# Patient Record
Sex: Female | Born: 1970 | Race: Black or African American | Hispanic: No | Marital: Single | Smoking: Never smoker
Health system: Southern US, Community
[De-identification: ages and names within clinical notes are randomized; demographics above are authoritative.]

---

## 2020-02-12 ENCOUNTER — Other Ambulatory Visit: Payer: Self-pay | Admitting: Obstetrics and Gynecology

## 2020-02-12 DIAGNOSIS — D259 Leiomyoma of uterus, unspecified: Secondary | ICD-10-CM

## 2020-02-18 ENCOUNTER — Other Ambulatory Visit: Payer: Self-pay | Admitting: Interventional Radiology

## 2020-02-18 ENCOUNTER — Ambulatory Visit
Admission: RE | Admit: 2020-02-18 | Discharge: 2020-02-18 | Disposition: A | Discharge status: Home / Self Care | Payer: 59 | Source: Ambulatory Visit | Attending: Obstetrics and Gynecology | Admitting: Obstetrics and Gynecology

## 2020-02-18 ENCOUNTER — Encounter: Payer: Self-pay | Admitting: *Deleted

## 2020-02-18 ENCOUNTER — Other Ambulatory Visit: Payer: Self-pay

## 2020-02-18 DIAGNOSIS — D250 Submucous leiomyoma of uterus: Secondary | ICD-10-CM

## 2020-02-18 DIAGNOSIS — D259 Leiomyoma of uterus, unspecified: Secondary | ICD-10-CM

## 2020-02-18 HISTORY — PX: IR RADIOLOGIST EVAL & MGMT: IMG5224

## 2020-02-18 NOTE — Consult Note (Signed)
Chief Complaint:  Symptomatic uterine fibroids  Referring Physician(s): Cousins,Sheronette  History of Present Illness: Erin Bates is a 49 y.o. female G2, P1 A1.  No future pregnancy plans.  No menopausal symptoms.  Review of her menstrual cycle performed.  She has a regular cycle approximately every 28 days.  Cycle length is 6 days with 6 heavy days including passage of fresh blood clots.  Frequency a pad changes every 30 minutes for all 6 days.  No intra-.  Bleeding.  She also has bulk related symptoms including lower abdominal pain and cramping during the cycle.  She also experiences fatigue and low energy level related to iron deficiency anemia.  Symptoms have progressed over the last year.  No recent fiber therapies including hormone replacement therapies or surgeries.  She has had a previous tubal ligation.  No recent GYN infections or abnormal discharge.  Reported recent Pap smear was normal.  Office ultrasound 4-1 21 demonstrates at least 4 measurable fibroids measuring up to 8 cm.  Uterus measures up to 17 cm in length.  No past medical history on file.    Allergies: Patient has no allergy information on record.  Medications: Prior to Admission medications   Not on File     No family history on file.  Social History   Socioeconomic History  . Marital status: Not on file    Spouse name: Not on file  . Number of children: Not on file  . Years of education: Not on file  . Highest education level: Not on file  Occupational History  . Not on file  Tobacco Use  . Smoking status: Not on file  Substance and Sexual Activity  . Alcohol use: Not on file  . Drug use: Not on file  . Sexual activity: Not on file  Other Topics Concern  . Not on file  Social History Narrative  . Not on file   Social Determinants of Health   Financial Resource Strain:   . Difficulty of Paying Living Expenses:   Food Insecurity:   . Worried About Charity fundraiser in the Last  Year:   . Arboriculturist in the Last Year:   Transportation Needs:   . Film/video editor (Medical):   Marland Kitchen Lack of Transportation (Non-Medical):   Physical Activity:   . Days of Exercise per Week:   . Minutes of Exercise per Session:   Stress:   . Feeling of Stress :   Social Connections:   . Frequency of Communication with Friends and Family:   . Frequency of Social Gatherings with Friends and Family:   . Attends Religious Services:   . Active Member of Clubs or Organizations:   . Attends Archivist Meetings:   Marland Kitchen Marital Status:     Review of Systems  Review of Systems: A 12 point ROS discussed and pertinent positives are indicated in the HPI above.  All other systems are negative.  Physical Exam No direct physical exam was performed, telephone health visit only today because of Covid pandemic.   Vital Signs: There were no vitals taken for this visit.  Imaging: No results found.  Labs:  CBC: No results for input(s): WBC, HGB, HCT, PLT in the last 8760 hours.  COAGS: No results for input(s): INR, APTT in the last 8760 hours.  BMP: No results for input(s): NA, K, CL, CO2, GLUCOSE, BUN, CALCIUM, CREATININE, GFRNONAA, GFRAA in the last 8760 hours.  Invalid input(s): CMP  LIVER FUNCTION TESTS: No results for input(s): BILITOT, AST, ALT, ALKPHOS, PROT, ALBUMIN in the last 8760 hours.   Assessment and Plan:  Symptomatic uterine fibroids with abnormal menstrual bleeding and associated lower abdominal pain, fatigue, and low energy level related to iron deficiency anemia and chronic blood loss.  Treatment options reviewed including uterine fibroid embolization.  The embolization procedure was reviewed in detail including the procedure, overnight recovery, expected goals, outcomes, risk, and complications.  All questions were addressed.  She has a clear understanding of the procedure.  She would like to proceed with a work-up including pelvic MRI.  Plan:  Scheduled for outpatient pelvic MRI without and with contrast to assess fibroid anatomy for embolization.  Anticipate scheduling her procedure in the next few weeks at [facility].  Thank you for this interesting consult.  I greatly enjoyed meeting Emila Veith and look forward to participating in their care.  A copy of this report was sent to the requesting provider on this date.  Electronically Signed: Greggory Keen 02/18/2020, 1:38 PM   I spent a total of  40 Minutes   in remote  clinical consultation, greater than 50% of which was counseling/coordinating care for this patient with symptomatic uterine fibroids.    Visit type: Audio only (telephone). Audio (no video) only due to patient's lack of internet/smartphone capability. Alternative for in-person consultation at [facility], [address] [place], [place], [place]. This visit type was conducted due to national recommendations for restrictions regarding the COVID-19 Pandemic (e.g. social distancing).  This format is felt to be most appropriate for this patient at this time.  All issues noted in this document were discussed and addressed.

## 2020-03-10 ENCOUNTER — Ambulatory Visit (HOSPITAL_COMMUNITY)
Admission: RE | Admit: 2020-03-10 | Discharge: 2020-03-10 | Disposition: A | Discharge status: Home / Self Care | Payer: Managed Care, Other (non HMO) | Source: Ambulatory Visit | Attending: Interventional Radiology | Admitting: Interventional Radiology

## 2020-03-10 ENCOUNTER — Other Ambulatory Visit: Payer: Self-pay

## 2020-03-10 DIAGNOSIS — D250 Submucous leiomyoma of uterus: Secondary | ICD-10-CM | POA: Diagnosis present

## 2020-03-10 MED ORDER — GADOBUTROL 1 MMOL/ML IV SOLN
9.0000 mL | Freq: Once | INTRAVENOUS | Status: AC | PRN
  Administered 2020-03-10: 9 mL via INTRAVENOUS

## 2020-03-24 ENCOUNTER — Other Ambulatory Visit (HOSPITAL_COMMUNITY): Payer: Self-pay | Admitting: Interventional Radiology

## 2020-03-24 DIAGNOSIS — D259 Leiomyoma of uterus, unspecified: Secondary | ICD-10-CM

## 2020-04-10 ENCOUNTER — Inpatient Hospital Stay (HOSPITAL_COMMUNITY): Admission: RE | Admit: 2020-04-10 | Payer: Managed Care, Other (non HMO) | Source: Ambulatory Visit

## 2020-04-13 ENCOUNTER — Other Ambulatory Visit (HOSPITAL_COMMUNITY)
Admission: RE | Admit: 2020-04-13 | Discharge: 2020-04-13 | Disposition: A | Discharge status: Home / Self Care | Payer: Managed Care, Other (non HMO) | Source: Ambulatory Visit | Attending: Interventional Radiology | Admitting: Interventional Radiology

## 2020-04-13 ENCOUNTER — Other Ambulatory Visit: Payer: Self-pay | Admitting: Radiology

## 2020-04-13 DIAGNOSIS — Z01812 Encounter for preprocedural laboratory examination: Secondary | ICD-10-CM | POA: Insufficient documentation

## 2020-04-13 DIAGNOSIS — Z20822 Contact with and (suspected) exposure to covid-19: Secondary | ICD-10-CM | POA: Diagnosis not present

## 2020-04-13 LAB — SARS CORONAVIRUS 2 (TAT 6-24 HRS): SARS Coronavirus 2: NEGATIVE

## 2020-04-14 ENCOUNTER — Ambulatory Visit (HOSPITAL_COMMUNITY)
Admission: RE | Admit: 2020-04-14 | Discharge: 2020-04-14 | Disposition: A | Discharge status: Home / Self Care | Payer: Managed Care, Other (non HMO) | Source: Ambulatory Visit | Attending: Interventional Radiology | Admitting: Interventional Radiology

## 2020-04-14 ENCOUNTER — Encounter (HOSPITAL_COMMUNITY): Payer: Self-pay

## 2020-04-14 ENCOUNTER — Observation Stay (HOSPITAL_COMMUNITY)
Admission: RE | Admit: 2020-04-14 | Discharge: 2020-04-15 | Disposition: A | Discharge status: Home / Self Care | Payer: Managed Care, Other (non HMO) | Source: Ambulatory Visit | Attending: Interventional Radiology | Admitting: Interventional Radiology

## 2020-04-14 ENCOUNTER — Other Ambulatory Visit: Payer: Self-pay

## 2020-04-14 DIAGNOSIS — D509 Iron deficiency anemia, unspecified: Secondary | ICD-10-CM | POA: Diagnosis not present

## 2020-04-14 DIAGNOSIS — D250 Submucous leiomyoma of uterus: Secondary | ICD-10-CM | POA: Diagnosis present

## 2020-04-14 DIAGNOSIS — D219 Benign neoplasm of connective and other soft tissue, unspecified: Secondary | ICD-10-CM | POA: Diagnosis present

## 2020-04-14 DIAGNOSIS — D259 Leiomyoma of uterus, unspecified: Secondary | ICD-10-CM

## 2020-04-14 HISTORY — PX: IR ANGIOGRAM PELVIS SELECTIVE OR SUPRASELECTIVE: IMG661

## 2020-04-14 HISTORY — PX: IR ANGIOGRAM SELECTIVE EACH ADDITIONAL VESSEL: IMG667

## 2020-04-14 HISTORY — PX: IR US GUIDE VASC ACCESS RIGHT: IMG2390

## 2020-04-14 HISTORY — PX: IR EMBO TUMOR ORGAN ISCHEMIA INFARCT INC GUIDE ROADMAPPING: IMG5449

## 2020-04-14 HISTORY — PX: IR US GUIDE VASC ACCESS LEFT: IMG2389

## 2020-04-14 LAB — CBC
HCT: 33.2 % — ABNORMAL LOW (ref 36.0–46.0)
Hemoglobin: 10.7 g/dL — ABNORMAL LOW (ref 12.0–15.0)
MCH: 28.1 pg (ref 26.0–34.0)
MCHC: 32.2 g/dL (ref 30.0–36.0)
MCV: 87.1 fL (ref 80.0–100.0)
Platelets: 319 10*3/uL (ref 150–400)
RBC: 3.81 MIL/uL — ABNORMAL LOW (ref 3.87–5.11)
RDW: 14 % (ref 11.5–15.5)
WBC: 3.7 10*3/uL — ABNORMAL LOW (ref 4.0–10.5)
nRBC: 0 % (ref 0.0–0.2)

## 2020-04-14 LAB — BASIC METABOLIC PANEL
Anion gap: 9 (ref 5–15)
BUN: 11 mg/dL (ref 6–20)
CO2: 25 mmol/L (ref 22–32)
Calcium: 8.6 mg/dL — ABNORMAL LOW (ref 8.9–10.3)
Chloride: 105 mmol/L (ref 98–111)
Creatinine, Ser: 0.49 mg/dL (ref 0.44–1.00)
GFR calc Af Amer: >60 mL/min (ref >60)
GFR calc non Af Amer: >60 mL/min (ref >60)
Glucose, Bld: 95 mg/dL (ref 70–99)
Potassium: 3.7 mmol/L (ref 3.5–5.1)
Sodium: 139 mmol/L (ref 135–145)

## 2020-04-14 LAB — APTT: aPTT: 30 seconds (ref 24–36)

## 2020-04-14 LAB — TYPE AND SCREEN
ABO/RH(D): O POS
Antibody Screen: NEGATIVE

## 2020-04-14 LAB — PROTIME-INR
INR: 0.9 (ref 0.8–1.2)
Prothrombin Time: 12 seconds (ref 11.4–15.2)

## 2020-04-14 LAB — ABO/RH: ABO/RH(D): O POS

## 2020-04-14 LAB — PREGNANCY, URINE: Preg Test, Ur: NEGATIVE

## 2020-04-14 MED ORDER — HYDRALAZINE HCL 20 MG/ML IJ SOLN
INTRAMUSCULAR | Status: AC
  Administered 2020-04-14: 10 mg via INTRAVENOUS
  Filled 2020-04-14: qty 1

## 2020-04-14 MED ORDER — DIPHENHYDRAMINE HCL 12.5 MG/5ML PO ELIX
12.5000 mg | ORAL_SOLUTION | Freq: Four times a day (QID) | ORAL | Status: DC | PRN
  Filled 2020-04-14: qty 5

## 2020-04-14 MED ORDER — HYDROMORPHONE 1 MG/ML IV SOLN
INTRAVENOUS | Status: DC
  Administered 2020-04-14: 30 mg via INTRAVENOUS
  Administered 2020-04-14: 0.9 mg via INTRAVENOUS
  Administered 2020-04-15 (×2): 0.3 mg via INTRAVENOUS

## 2020-04-14 MED ORDER — ONDANSETRON HCL 4 MG/2ML IJ SOLN: 4.0000 mg | Freq: Four times a day (QID) | INTRAMUSCULAR | Status: DC | PRN

## 2020-04-14 MED ORDER — SODIUM CHLORIDE 0.9% FLUSH: 3.0000 mL | INTRAVENOUS | Status: DC | PRN

## 2020-04-14 MED ORDER — SODIUM CHLORIDE 0.9% FLUSH
3.0000 mL | Freq: Two times a day (BID) | INTRAVENOUS | Status: DC
  Administered 2020-04-15: 3 mL via INTRAVENOUS

## 2020-04-14 MED ORDER — CHLORHEXIDINE GLUCONATE CLOTH 2 % EX PADS
6.0000 | MEDICATED_PAD | Freq: Every day | CUTANEOUS | Status: DC
  Administered 2020-04-15: 6 via TOPICAL

## 2020-04-14 MED ORDER — LIDOCAINE HCL (PF) 1 % IJ SOLN
INTRAMUSCULAR | Status: AC | PRN
  Administered 2020-04-14: 5 mL

## 2020-04-14 MED ORDER — PROMETHAZINE HCL 25 MG PO TABS: 25.0000 mg | ORAL_TABLET | Freq: Three times a day (TID) | ORAL | Status: DC | PRN

## 2020-04-14 MED ORDER — FENTANYL CITRATE (PF) 100 MCG/2ML IJ SOLN
INTRAMUSCULAR | Status: AC
  Filled 2020-04-14: qty 4

## 2020-04-14 MED ORDER — SODIUM CHLORIDE 0.9 % IV SOLN: INTRAVENOUS | Status: DC

## 2020-04-14 MED ORDER — MIDAZOLAM HCL 2 MG/2ML IJ SOLN
INTRAMUSCULAR | Status: AC
  Filled 2020-04-14: qty 6

## 2020-04-14 MED ORDER — SODIUM CHLORIDE 0.9% FLUSH: 9.0000 mL | INTRAVENOUS | Status: DC | PRN

## 2020-04-14 MED ORDER — IOHEXOL 300 MG/ML  SOLN
100.0000 mL | Freq: Once | INTRAMUSCULAR | Status: AC | PRN
  Administered 2020-04-14: 70 mL via INTRA_ARTERIAL

## 2020-04-14 MED ORDER — KETOROLAC TROMETHAMINE 30 MG/ML IJ SOLN
30.0000 mg | Freq: Four times a day (QID) | INTRAMUSCULAR | Status: DC
  Administered 2020-04-14 – 2020-04-15 (×5): 30 mg via INTRAVENOUS
  Filled 2020-04-14 (×5): qty 1

## 2020-04-14 MED ORDER — FENTANYL CITRATE (PF) 100 MCG/2ML IJ SOLN
INTRAMUSCULAR | Status: AC | PRN
  Administered 2020-04-14 (×4): 50 ug via INTRAVENOUS

## 2020-04-14 MED ORDER — ONDANSETRON HCL 4 MG/2ML IJ SOLN
4.0000 mg | Freq: Once | INTRAMUSCULAR | Status: AC
  Administered 2020-04-14: 4 mg via INTRAVENOUS
  Filled 2020-04-14: qty 2

## 2020-04-14 MED ORDER — NALOXONE HCL 0.4 MG/ML IJ SOLN: 0.4000 mg | INTRAMUSCULAR | Status: DC | PRN

## 2020-04-14 MED ORDER — MIDAZOLAM HCL 2 MG/2ML IJ SOLN
INTRAMUSCULAR | Status: AC | PRN
  Administered 2020-04-14 (×5): 1 mg via INTRAVENOUS

## 2020-04-14 MED ORDER — CEFAZOLIN SODIUM-DEXTROSE 2-4 GM/100ML-% IV SOLN
INTRAVENOUS | Status: AC
  Administered 2020-04-14: 2 g via INTRAVENOUS
  Filled 2020-04-14: qty 100

## 2020-04-14 MED ORDER — SODIUM CHLORIDE 0.9 % IV SOLN: 250.0000 mL | INTRAVENOUS | Status: DC | PRN

## 2020-04-14 MED ORDER — CEFAZOLIN SODIUM-DEXTROSE 2-4 GM/100ML-% IV SOLN: 2.0000 g | Freq: Once | INTRAVENOUS | Status: AC

## 2020-04-14 MED ORDER — HYDRALAZINE HCL 20 MG/ML IJ SOLN: 10.0000 mg | Freq: Once | INTRAMUSCULAR | Status: AC

## 2020-04-14 MED ORDER — DIPHENHYDRAMINE HCL 50 MG/ML IJ SOLN: 12.5000 mg | Freq: Four times a day (QID) | INTRAMUSCULAR | Status: DC | PRN

## 2020-04-14 MED ORDER — PROMETHAZINE HCL 25 MG RE SUPP: 25.0000 mg | Freq: Three times a day (TID) | RECTAL | Status: DC | PRN

## 2020-04-14 MED ORDER — DOCUSATE SODIUM 100 MG PO CAPS
100.0000 mg | ORAL_CAPSULE | Freq: Two times a day (BID) | ORAL | Status: DC
  Administered 2020-04-14 – 2020-04-15 (×2): 100 mg via ORAL
  Filled 2020-04-14 (×2): qty 1

## 2020-04-14 MED ORDER — KETOROLAC TROMETHAMINE 30 MG/ML IJ SOLN
30.0000 mg | Freq: Once | INTRAMUSCULAR | Status: AC
  Administered 2020-04-14: 30 mg via INTRAVENOUS
  Filled 2020-04-14: qty 1

## 2020-04-14 MED ORDER — LIDOCAINE HCL 1 % IJ SOLN
INTRAMUSCULAR | Status: AC
  Filled 2020-04-14: qty 20

## 2020-04-14 NOTE — Sedation Documentation (Signed)
B/P 163/117.  10mg  Hydralazine ordered and given.

## 2020-04-14 NOTE — Procedures (Signed)
Interventional Radiology Procedure Note  Procedure: UFE  Complications: None  Estimated Blood Loss: min  Findings: Successful bilateral embo

## 2020-04-14 NOTE — H&P (Addendum)
Chief Complaint: Patient was seen in consultation today for uterine fibroid embolization.   Referring Physician(s): Shick,Michael  Supervising Physician: Daryll Brod  Patient Status: Erin Bates - In-pt  History of Present Illness: Erin Bates is a 49 y.o. female with a history of symptomatic uterine fibroids with abnormal menstrual bleeding. She has associated symptoms of lower abdominal pain, fatigue and low energy levels secondary to iron deficiency anemia and chronic blood loss. She had a tele-visit consultation with Dr. Annamaria Boots on 02/18/2020 to discuss treatment options. She had an MRI pelvis 03/10/2020 which demonstrated: Enlarged uterus with multiple uterine fibroids, as described above. At least 5 subserosal fibroids are seen which have broad bases of myometrial attachment. No intracavitary fibroids identified.   The patient is G2, P1, A1 and has no desire for future pregnancies. The patient also had a tubal ligation approximately 20 years ago.   Allergies: Prednisone  Medications: Prior to Admission medications   Medication Sig Start Date End Date Taking? Authorizing Provider  cholecalciferol (VITAMIN D3) 25 MCG (1000 UNIT) tablet Take 1,000 Units by mouth daily. daily   Yes [provider]  ibuprofen (ADVIL) 400 MG tablet Take 800 mg by mouth every 6 (six) hours as needed (as needed).   Yes [provider]     No family history on file.  Social History   Socioeconomic History  . Marital status: Single    Spouse name: Not on file  . Number of children: Not on file  . Years of education: Not on file  . Highest education level: Not on file  Occupational History  . Not on file  Tobacco Use  . Smoking status: Not on file  Substance and Sexual Activity  . Alcohol use: Not on file  . Drug use: Not on file  . Sexual activity: Not on file  Other Topics Concern  . Not on file  Social History Narrative  . Not on file   Social Determinants of Health    Financial Resource Strain:   . Difficulty of Paying Living Expenses:   Food Insecurity:   . Worried About Charity fundraiser in the Last Year:   . Arboriculturist in the Last Year:   Transportation Needs:   . Film/video editor (Medical):   Marland Kitchen Lack of Transportation (Non-Medical):   Physical Activity:   . Days of Exercise per Week:   . Minutes of Exercise per Session:   Stress:   . Feeling of Stress :   Social Connections:   . Frequency of Communication with Friends and Family:   . Frequency of Social Gatherings with Friends and Family:   . Attends Religious Services:   . Active Member of Clubs or Organizations:   . Attends Archivist Meetings:   Marland Kitchen Marital Status:       Review of Systems: A 12 point ROS discussed and pertinent positives are indicated in the HPI above.  All other systems are negative.  Review of Systems  Constitutional: Negative for fatigue and fever.  Respiratory: Negative for cough and shortness of breath.   Cardiovascular: Negative for chest pain and leg swelling.  Gastrointestinal: Negative for abdominal distention, abdominal pain, diarrhea, nausea and vomiting.  Genitourinary: Negative for difficulty urinating.  Musculoskeletal: Negative for back pain.  Neurological: Negative for headaches.    Vital Signs: BP (!) (P) 128/103 Comment: left arm  Pulse 86   Temp 98.3 F (36.8 C) (Oral)   Resp 18   Ht 5'  1" (1.549 m)   Wt 192 lb (87.1 kg)   LMP 04/09/2020 (Approximate)   SpO2 99%   BMI 36.28 kg/m   Physical Exam Constitutional:      General: She is not in acute distress.    Appearance: Normal appearance.  HENT:     Mouth/Throat:     Mouth: Mucous membranes are moist.  Cardiovascular:     Rate and Rhythm: Normal rate and regular rhythm.     Pulses: Normal pulses.     Heart sounds: Normal heart sounds.  Pulmonary:     Effort: Pulmonary effort is normal.     Breath sounds: Normal breath sounds.  Abdominal:     General:  Bowel sounds are normal. There is no distension.     Palpations: Abdomen is soft.     Tenderness: There is no abdominal tenderness.  Genitourinary:    Comments: Foley catheter in place; clear yellow urine in bag.  Musculoskeletal:        General: Normal range of motion.  Skin:    General: Skin is warm and dry.  Neurological:     Mental Status: She is alert and oriented to person, place, and time.  Psychiatric:        Mood and Affect: Mood normal.        Behavior: Behavior normal.        Thought Content: Thought content normal.        Judgment: Judgment normal.     Imaging: No results found.  Labs:  CBC: Recent Labs    04/14/20 0845  WBC 3.7*  HGB 10.7*  HCT 33.2*  PLT 319    COAGS: No results for input(s): INR, APTT in the last 8760 hours.  BMP: Recent Labs    04/14/20 0845  NA 139  K 3.7  CL 105  CO2 25  GLUCOSE 95  BUN 11  CALCIUM 8.6*  CREATININE 0.49  GFRNONAA >60  GFRAA >60    LIVER FUNCTION TESTS: No results for input(s): BILITOT, AST, ALT, ALKPHOS, PROT, ALBUMIN in the last 8760 hours.  TUMOR MARKERS: No results for input(s): AFPTM, CEA, CA199, CHROMGRNA in the last 8760 hours.  Assessment and Plan:  Erin Bates is a 49 y.o. female with a history of symptomatic uterine fibroids with abnormal menstrual bleeding. She presents today to the [facility] department for a uterine fibroid embolization with Dr. Annamaria Boots.  The patient has been NPO. She is not on any blood thinning medications. The patient will receive pre-procedure medications including IV Zofran and Toradol. She will also receive prophylactic IV Cefazolin. The patient will be admitted for overnight observation.    The Risks and benefits of uterine fibroid embolization were discussed with the patient including, but not limited to bleeding, infection, vascular injury, post operative pain, or contrast induced renal failure.  This procedure involves the use of  X-rays and because of the nature of the planned procedure, it is possible that we will have prolonged use of X-ray fluoroscopy.  Potential radiation risks to you include (but are not limited to) the following: - A slightly elevated risk for cancer several years later in life. This risk is typically less than 0.5% percent. This risk is low in comparison to the normal incidence of human cancer, which is 33% for women and 50% for men according to the [employer]. - Radiation induced injury can include skin redness, resembling a rash, tissue breakdown / ulcers and hair loss (which can be  temporary or permanent).  The likelihood of either of these occurring depends on the difficulty of the procedure and whether you are sensitive to radiation due to previous procedures, disease, or genetic conditions.  IF your procedure requires a prolonged use of radiation, you will be notified and given written instructions for further action. It is your responsibility to monitor the irradiated area for the 2 weeks following the procedure and to notify your physician if you are concerned that you have suffered a radiation induced injury.   All of the patient's questions were answered, patient is agreeable to proceed. Consent signed and in chart.    Thank you for this interesting consult.  I greatly enjoyed meeting Erin Bates and look forward to participating in their care.  A copy of this report was sent to the requesting provider on this date.  Electronically Signed: Theresa Duty, NP 04/14/2020, 10:11 AM   I spent a total of  30 Minutes   in face to face in clinical consultation, greater than 50% of which was counseling/coordinating care for uterine fibroid embolization.

## 2020-04-14 NOTE — Progress Notes (Addendum)
Bedside report receiving from Linna Hoff, [place].  Patient arrived to room with foley inserted in prior to procedure, PCA verified.   Bil dressing site C/D/I.   Patient is alert and wake, aware to be bedrest/lying flat in bed until after 4PM today. She denied minimal pain. She is currently on her menstrual cycle. peri care provided. SCDs ordered. Family notified of arrival per pt's request.

## 2020-04-15 ENCOUNTER — Other Ambulatory Visit: Payer: Self-pay | Admitting: Physician Assistant

## 2020-04-15 DIAGNOSIS — D259 Leiomyoma of uterus, unspecified: Secondary | ICD-10-CM

## 2020-04-15 DIAGNOSIS — D250 Submucous leiomyoma of uterus: Secondary | ICD-10-CM | POA: Diagnosis not present

## 2020-04-15 MED ORDER — DOCUSATE SODIUM 100 MG PO CAPS: 100.0000 mg | ORAL_CAPSULE | Freq: Two times a day (BID) | ORAL | 0 refills | Status: AC

## 2020-04-15 MED ORDER — HYDROCODONE-ACETAMINOPHEN 5-325 MG PO TABS: 1.0000 | ORAL_TABLET | Freq: Four times a day (QID) | ORAL | 0 refills | Status: AC | PRN

## 2020-04-15 NOTE — Discharge Summary (Signed)
Patient ID: Erin Bates MRN: [id number] DOB/AGE: 1971/03/20 49 y.o.  Admit date: 04/14/2020 Discharge date: 04/15/2020  Supervising Physician: Jacqulynn Cadet  Patient Status: [facility] - In-pt  Admission Diagnoses: Symptomatic uterine fibroids  Discharge Diagnoses:  Active Problems:   Fibroids   Fibroids, submucosal   Discharged Condition: good  Hospital Course:   Erin Bates is a 49 y.o. female with a history of symptomatic uterine fibroids with abnormal menstrual bleeding.   She has associated symptoms of lower abdominal pain, fatigue and low energy levels secondary to iron deficiency anemia and chronic blood loss.   She had a tele-visit consultation with Dr. Annamaria Boots on 02/18/2020 to discuss treatment options.   She had an MRI pelvis 03/10/2020 which showed= Enlarged uterus with multiple uterine fibroids, as described above.  At least 5 subserosal fibroids are seen which have broad bases of myometrial attachment. No intracavitary fibroids identified.   Dr. Annamaria Boots felt she was an excellent candidate for uterine fibroid embolization.  She had the procedure yesterday with no complications.  Her pain was controlled overnight with a PCA.   Her foley was D/C'd this morning and she has voided normally. She has ambulated.   She only c/o some pain in the front of her thighs which could be secondary to ischemic fibroid tissue.   Treatments:  INDICATION: Symptomatic uterine fibroid menstrual bleeding  EXAM: UTERINE FIBROID EMBOLIZATION  Date:  04/14/2020 04/14/2020 12:48 pm  Radiologist:  Jerilynn Mages. Daryll Brod, MD  Guidance:  Ultrasound fluoroscopic  MEDICATIONS: Ancef 2 g. The antibiotic was administered within 1 hour of the procedure  ANESTHESIA/SEDATION: Fentanyl 200 mcg IV; Versed 5 0 mg IV, 10 mg hydralazine  Moderate Sedation Time:  58 minutes  The patient was continuously monitored during the procedure by the interventional radiology nurse under my direct  supervision.  CONTRAST:  38mL OMNIPAQUE IOHEXOL 300 MG/ML SOLN, 16mL OMNIPAQUE IOHEXOL 300 MG/ML SOLN  FLUOROSCOPY TIME:  Fluoroscopy Time: 15 minutes 0 seconds (2,600 mGy).  COMPLICATIONS: None immediate.  PROCEDURE: Informed consent was obtained from the patient following explanation of the procedure, risks, benefits and alternatives. The patient understands, agrees and consents for the procedure. All questions were addressed. A time out was performed.  Maximal barrier sterile technique utilized including caps, mask, sterile gowns, sterile gloves, large sterile drape, hand hygiene, and betadine prep.  Under sterile conditions and local anesthesia, bilateralcommon femoral artery access was performed with micropuncture needles. Ultrasound was utilized for access. Images were obtained for documentation the patent common femoral arteries. Guidewires were advanced followed insertion of bilateral 5 French sheaths.  C2 catheter was utilized to select the contralateral internal iliac artery. Selective left internal iliac angiogram was performed. The tortuous left uterine artery was identified. Selective catheterization was performed of the left uterine artery with a microcatheter and micro guide wire. A selective left uterine angiogram was performed. This demonstrated patency of the left uterine artery. Mild diffuse hypervascularity of the enlarged fibroid uterus. Access was adequate for embolization. For embolization, 1.5 vials of 500 - 758micron Embospheres and 3 vialsof 700-900 micron Embospheres were injected into the left uterine artery. Post embolization angiogram confirms complete stasis of the left uterine vascular territory. Microcatheter was removed.  C2 catheter also utilized to select the right internal iliac artery. Selective right internal iliac angiogram was performed. The patent right uterine artery was identified. For selective catheterization, the micro  catheter and guidewire were utilized to select the right uterine artery. Selective right uterine angiogram was performed. This demonstrated patency  of the right uterine artery. Catheter position was safe for embolization. Embolization was performed to complete stasis with injection of 1.5 vials of 500-700 micron Embospheres and 1 vialof 700-900 micron Embospheres. Post embolization angiogram confirms complete stasis of the right uterine vascular territory.  Microcatheter and C2 catheters were removed.  Injection of the bilateralcommon femoral artery sheath confirms access is adequate for ExoSeal hemostasis was obtained with ExoSeal bilaterally. No immediate complication. Peripheral pedal pulses are normal +2.  The patient tolerated the procedure well. No immediate complication.  IMPRESSION: Successful bilateral uterine artery embolization (U F E)   Electronically Signed   By: Jerilynn Mages.  Shick M.D.   On: 04/14/2020 13:45   Discharge Exam: Blood pressure 122/80, pulse 84, temperature 97.9 F (36.6 C), temperature source Oral, resp. rate 13, height 5\' 1"  (1.549 m), weight 87.1 kg, last menstrual period 04/09/2020, SpO2 100 %. Awake and alert NAD Abdomen soft, non-tender to palpation. Breathing normally Bilateral common femoral artery access sites look good, no hematoma or pseudoaneurysm. Calves soft. No lower extremity edema.  Disposition: Discharge disposition: 01-Home or Self Care      *Our office will call Erin Bates for a follow up appointment in about 3 weeks.  Discharge Instructions    Call MD for:  difficulty breathing, headache or visual disturbances   Complete by: As directed    Call MD for:  hives   Complete by: As directed    Call MD for:  persistant dizziness or light-headedness   Complete by: As directed    Call MD for:  persistant nausea and vomiting   Complete by: As directed    Call MD for:  redness, tenderness, or signs of infection (pain, swelling,  redness, odor or green/yellow discharge around incision site)   Complete by: As directed    Call MD for:  severe uncontrolled pain   Complete by: As directed    Call MD for:  temperature >100.4   Complete by: As directed    Diet - low sodium heart healthy   Complete by: As directed    Increase activity slowly   Complete by: As directed      Allergies as of 04/15/2020      Reactions   Prednisone Other (See Comments)   Feels like heart coming out of chest   Hydrochlorothiazide Swelling   Lisinopril Swelling      Medication List    TAKE these medications   albuterol 108 (90 Base) MCG/ACT inhaler Commonly known as: VENTOLIN HFA Inhale 1 puff into the lungs every 6 (six) hours as needed for wheezing or shortness of breath.   cholecalciferol 25 MCG (1000 UNIT) tablet Commonly known as: VITAMIN D3 Take 1,000 Units by mouth daily. daily   docusate sodium 100 MG capsule Commonly known as: COLACE Take 1 capsule (100 mg total) by mouth 2 (two) times daily.   HYDROcodone-acetaminophen 5-325 MG tablet Commonly known as: Norco Take 1 tablet by mouth every 6 (six) hours as needed for up to 20 doses for moderate pain.   ibuprofen 800 MG tablet Commonly known as: ADVIL Take 800 mg by mouth every 8 (eight) hours.         Electronically Signed: Murrell Redden, PA-C 04/15/2020, 11:36 AM     I have spent Less Than 30 Minutes discharging Erin Bates.

## 2020-04-15 NOTE — Progress Notes (Signed)
Pt verbalized understanding of dc instructions. Ambulating in room, gait steady, voided without complications post removal of foley. Tolerated oral intake. Waiting for transportation via family member.

## 2020-04-15 NOTE — Progress Notes (Addendum)
Patient's Dilaudid PCA discontinued, unable to waste amount left in syringe in the pyxis, medication not showing removal from pyxis.  21 cc of dilaudid wasted in steri-cycle with [place] as a witness.

## 2020-05-07 ENCOUNTER — Ambulatory Visit
Admission: RE | Admit: 2020-05-07 | Discharge: 2020-05-07 | Disposition: A | Discharge status: Home / Self Care | Payer: Managed Care, Other (non HMO) | Source: Ambulatory Visit | Attending: Physician Assistant | Admitting: Physician Assistant

## 2020-05-07 ENCOUNTER — Other Ambulatory Visit: Payer: Self-pay

## 2020-05-07 ENCOUNTER — Encounter: Payer: Self-pay | Admitting: *Deleted

## 2020-05-07 DIAGNOSIS — D259 Leiomyoma of uterus, unspecified: Secondary | ICD-10-CM

## 2020-05-07 HISTORY — PX: IR RADIOLOGIST EVAL & MGMT: IMG5224

## 2020-05-07 NOTE — Progress Notes (Signed)
Patient ID: Erin Bates, female   DOB: July 28, 1971, 49 y.o.   MRN: [id number]       Chief Complaint:  Uterine fibroids  Referring Physician(s): Dr. Garwin Brothers  History of Present Illness: Erin Bates is a 49 y.o. female with symptomatic uterine fibroids and abnormal menstrual bleeding.  She underwent successful uterine fibroid embolization at [facility] 04/14/2020.  Since the procedure she has recovered at home very well.  No significant pelvic pain or fever.  No recent illness.  No abnormal vaginal discharge.  She has had a bowel movement since the procedure.  She is currently not requiring any pain medicines except for an occasional Advil.  She went back to work today.  Minor discomfort at the femoral puncture sites however this is improving.  She is starting to exercise again in the form of walking.  Overall she is recovering very well.  No past medical history on file.  Past Surgical History:  Procedure Laterality Date   IR ANGIOGRAM PELVIS SELECTIVE OR SUPRASELECTIVE  04/14/2020   IR ANGIOGRAM SELECTIVE EACH ADDITIONAL VESSEL  04/14/2020   IR EMBO TUMOR ORGAN ISCHEMIA INFARCT INC GUIDE ROADMAPPING  04/14/2020   IR RADIOLOGIST EVAL & MGMT  02/18/2020   IR US GUIDE VASC ACCESS LEFT  04/14/2020   IR US GUIDE VASC ACCESS RIGHT  04/14/2020    Allergies: Prednisone, Hydrochlorothiazide, and Lisinopril  Medications: Prior to Admission medications   Medication Sig Start Date End Date Taking? Authorizing Provider  albuterol (VENTOLIN HFA) 108 (90 Base) MCG/ACT inhaler Inhale 1 puff into the lungs every 6 (six) hours as needed for wheezing or shortness of breath.  12/11/19   [provider]  cholecalciferol (VITAMIN D3) 25 MCG (1000 UNIT) tablet Take 1,000 Units by mouth daily. daily    [provider]  docusate sodium (COLACE) 100 MG capsule Take 1 capsule (100 mg total) by mouth 2 (two) times daily. 04/15/20   Ardis Rowan, PA-C  HYDROcodone-acetaminophen  (NORCO) 5-325 MG tablet Take 1 tablet by mouth every 6 (six) hours as needed for up to 20 doses for moderate pain. 04/15/20   Ardis Rowan, PA-C  ibuprofen (ADVIL) 800 MG tablet Take 800 mg by mouth every 8 (eight) hours. 02/06/20   [provider]     No family history on file.  Social History   Socioeconomic History   Marital status: Single    Spouse name: Not on file   Number of children: Not on file   Years of education: Not on file   Highest education level: Not on file  Occupational History   Not on file  Tobacco Use   Smoking status: Never Smoker   Smokeless tobacco: Never Used  Vaping Use   Vaping Use: Never used  Substance and Sexual Activity   Alcohol use: Never   Drug use: Never   Sexual activity: Not on file  Other Topics Concern   Not on file  Social History Narrative   Not on file   Social Determinants of Health   Financial Resource Strain:    Difficulty of Paying Living Expenses:   Food Insecurity:    Worried About [place] in the Last Year:    Arboriculturist in the Last Year:   Transportation Needs:    Film/video editor (Medical):    Lack of Transportation (Non-Medical):   Physical Activity:    Days of Exercise per Week:    Minutes of Exercise per Session:  Stress:    Feeling of Stress :   Social Connections:    Frequency of Communication with Friends and Family:    Frequency of Social Gatherings with Friends and Family:    Attends Religious Services:    Active Member of Clubs or Organizations:    Attends Archivist Meetings:    Marital Status:     Review of Systems  Review of Systems: A 12 point ROS discussed and pertinent positives are indicated in the HPI above.  All other systems are negative.  Physical Exam No direct physical exam was performed, telephone health visit only today because of pandemic Vital Signs: LMP 04/09/2020 (Approximate)   Imaging: IR Angiogram  Pelvis Selective Or Supraselective  Result Date: 04/14/2020 INDICATION: Symptomatic uterine fibroid menstrual bleeding EXAM: UTERINE FIBROID EMBOLIZATION Date:  04/14/2020 04/14/2020 12:48 pm Radiologist:  Jerilynn Mages. Daryll Brod, MD Guidance:  Ultrasound fluoroscopic MEDICATIONS: Ancef 2 g. The antibiotic was administered within 1 hour of the procedure ANESTHESIA/SEDATION: Fentanyl 200 mcg IV; Versed 5 0 mg IV, 10 mg hydralazine Moderate Sedation Time:  58 minutes The patient was continuously monitored during the procedure by the interventional radiology nurse under my direct supervision. CONTRAST:  65mL OMNIPAQUE IOHEXOL 300 MG/ML SOLN, 75mL OMNIPAQUE IOHEXOL 300 MG/ML SOLN FLUOROSCOPY TIME:  Fluoroscopy Time: 15 minutes 0 seconds (2,600 mGy). COMPLICATIONS: None immediate. PROCEDURE: Informed consent was obtained from the patient following explanation of the procedure, risks, benefits and alternatives. The patient understands, agrees and consents for the procedure. All questions were addressed. A time out was performed. Maximal barrier sterile technique utilized including caps, mask, sterile gowns, sterile gloves, large sterile drape, hand hygiene, and betadine prep. Under sterile conditions and local anesthesia, bilateralcommon femoral artery access was performed with micropuncture needles. Ultrasound was utilized for access. Images were obtained for documentation the patent common femoral arteries. Guidewires were advanced followed insertion of bilateral 5 French sheaths. C2 catheter was utilized to select the contralateral internal iliac artery. Selective left internal iliac angiogram was performed. The tortuous left uterine artery was identified. Selective catheterization was performed of the left uterine artery with a microcatheter and micro guide wire. A selective left uterine angiogram was performed. This demonstrated patency of the left uterine artery. Mild diffuse hypervascularity of the enlarged fibroid uterus.  Access was adequate for embolization. For embolization, 1.5 vials of 500 - 732micron Embospheres and 3 vialsof 700-900 micron Embospheres were injected into the left uterine artery. Post embolization angiogram confirms complete stasis of the left uterine vascular territory. Microcatheter was removed. C2 catheter also utilized to select the right internal iliac artery. Selective right internal iliac angiogram was performed. The patent right uterine artery was identified. For selective catheterization, the micro catheter and guidewire were utilized to select the right uterine artery. Selective right uterine angiogram was performed. This demonstrated patency of the right uterine artery. Catheter position was safe for embolization. Embolization was performed to complete stasis with injection of 1.5 vials of 500-700 micron Embospheres and 1 vialof 700-900 micron Embospheres. Post embolization angiogram confirms complete stasis of the right uterine vascular territory. Microcatheter and C2 catheters were removed. Injection of the bilateralcommon femoral artery sheath confirms access is adequate for ExoSeal hemostasis was obtained with ExoSeal bilaterally. No immediate complication. Peripheral pedal pulses are normal +2. The patient tolerated the procedure well. No immediate complication. IMPRESSION: Successful bilateral uterine artery embolization (U F E) Electronically Signed   By: Jerilynn Mages.  Isbella Arline M.D.   On: 04/14/2020 13:45   IR Angiogram Selective Each  Additional Vessel  Result Date: 04/14/2020 INDICATION: Symptomatic uterine fibroid menstrual bleeding EXAM: UTERINE FIBROID EMBOLIZATION Date:  04/14/2020 04/14/2020 12:48 pm Radiologist:  Jerilynn Mages. Daryll Brod, MD Guidance:  Ultrasound fluoroscopic MEDICATIONS: Ancef 2 g. The antibiotic was administered within 1 hour of the procedure ANESTHESIA/SEDATION: Fentanyl 200 mcg IV; Versed 5 0 mg IV, 10 mg hydralazine Moderate Sedation Time:  58 minutes The patient was continuously monitored  during the procedure by the interventional radiology nurse under my direct supervision. CONTRAST:  42mL OMNIPAQUE IOHEXOL 300 MG/ML SOLN, 56mL OMNIPAQUE IOHEXOL 300 MG/ML SOLN FLUOROSCOPY TIME:  Fluoroscopy Time: 15 minutes 0 seconds (2,600 mGy). COMPLICATIONS: None immediate. PROCEDURE: Informed consent was obtained from the patient following explanation of the procedure, risks, benefits and alternatives. The patient understands, agrees and consents for the procedure. All questions were addressed. A time out was performed. Maximal barrier sterile technique utilized including caps, mask, sterile gowns, sterile gloves, large sterile drape, hand hygiene, and betadine prep. Under sterile conditions and local anesthesia, bilateralcommon femoral artery access was performed with micropuncture needles. Ultrasound was utilized for access. Images were obtained for documentation the patent common femoral arteries. Guidewires were advanced followed insertion of bilateral 5 French sheaths. C2 catheter was utilized to select the contralateral internal iliac artery. Selective left internal iliac angiogram was performed. The tortuous left uterine artery was identified. Selective catheterization was performed of the left uterine artery with a microcatheter and micro guide wire. A selective left uterine angiogram was performed. This demonstrated patency of the left uterine artery. Mild diffuse hypervascularity of the enlarged fibroid uterus. Access was adequate for embolization. For embolization, 1.5 vials of 500 - 729micron Embospheres and 3 vialsof 700-900 micron Embospheres were injected into the left uterine artery. Post embolization angiogram confirms complete stasis of the left uterine vascular territory. Microcatheter was removed. C2 catheter also utilized to select the right internal iliac artery. Selective right internal iliac angiogram was performed. The patent right uterine artery was identified. For selective  catheterization, the micro catheter and guidewire were utilized to select the right uterine artery. Selective right uterine angiogram was performed. This demonstrated patency of the right uterine artery. Catheter position was safe for embolization. Embolization was performed to complete stasis with injection of 1.5 vials of 500-700 micron Embospheres and 1 vialof 700-900 micron Embospheres. Post embolization angiogram confirms complete stasis of the right uterine vascular territory. Microcatheter and C2 catheters were removed. Injection of the bilateralcommon femoral artery sheath confirms access is adequate for ExoSeal hemostasis was obtained with ExoSeal bilaterally. No immediate complication. Peripheral pedal pulses are normal +2. The patient tolerated the procedure well. No immediate complication. IMPRESSION: Successful bilateral uterine artery embolization (U F E) Electronically Signed   By: Jerilynn Mages.  Ahjanae Cassel M.D.   On: 04/14/2020 13:45   IR US Guide Vasc Access Left  Result Date: 04/14/2020 INDICATION: Symptomatic uterine fibroid menstrual bleeding EXAM: UTERINE FIBROID EMBOLIZATION Date:  04/14/2020 04/14/2020 12:48 pm Radiologist:  Jerilynn Mages. Daryll Brod, MD Guidance:  Ultrasound fluoroscopic MEDICATIONS: Ancef 2 g. The antibiotic was administered within 1 hour of the procedure ANESTHESIA/SEDATION: Fentanyl 200 mcg IV; Versed 5 0 mg IV, 10 mg hydralazine Moderate Sedation Time:  58 minutes The patient was continuously monitored during the procedure by the interventional radiology nurse under my direct supervision. CONTRAST:  62mL OMNIPAQUE IOHEXOL 300 MG/ML SOLN, 42mL OMNIPAQUE IOHEXOL 300 MG/ML SOLN FLUOROSCOPY TIME:  Fluoroscopy Time: 15 minutes 0 seconds (2,600 mGy). COMPLICATIONS: None immediate. PROCEDURE: Informed consent was obtained from the patient following explanation of  the procedure, risks, benefits and alternatives. The patient understands, agrees and consents for the procedure. All questions were addressed. A  time out was performed. Maximal barrier sterile technique utilized including caps, mask, sterile gowns, sterile gloves, large sterile drape, hand hygiene, and betadine prep. Under sterile conditions and local anesthesia, bilateralcommon femoral artery access was performed with micropuncture needles. Ultrasound was utilized for access. Images were obtained for documentation the patent common femoral arteries. Guidewires were advanced followed insertion of bilateral 5 French sheaths. C2 catheter was utilized to select the contralateral internal iliac artery. Selective left internal iliac angiogram was performed. The tortuous left uterine artery was identified. Selective catheterization was performed of the left uterine artery with a microcatheter and micro guide wire. A selective left uterine angiogram was performed. This demonstrated patency of the left uterine artery. Mild diffuse hypervascularity of the enlarged fibroid uterus. Access was adequate for embolization. For embolization, 1.5 vials of 500 - 773micron Embospheres and 3 vialsof 700-900 micron Embospheres were injected into the left uterine artery. Post embolization angiogram confirms complete stasis of the left uterine vascular territory. Microcatheter was removed. C2 catheter also utilized to select the right internal iliac artery. Selective right internal iliac angiogram was performed. The patent right uterine artery was identified. For selective catheterization, the micro catheter and guidewire were utilized to select the right uterine artery. Selective right uterine angiogram was performed. This demonstrated patency of the right uterine artery. Catheter position was safe for embolization. Embolization was performed to complete stasis with injection of 1.5 vials of 500-700 micron Embospheres and 1 vialof 700-900 micron Embospheres. Post embolization angiogram confirms complete stasis of the right uterine vascular territory. Microcatheter and C2  catheters were removed. Injection of the bilateralcommon femoral artery sheath confirms access is adequate for ExoSeal hemostasis was obtained with ExoSeal bilaterally. No immediate complication. Peripheral pedal pulses are normal +2. The patient tolerated the procedure well. No immediate complication. IMPRESSION: Successful bilateral uterine artery embolization (U F E) Electronically Signed   By: Jerilynn Mages.  Keoni Risinger M.D.   On: 04/14/2020 13:45   IR US Guide Vasc Access Right  Result Date: 04/14/2020 INDICATION: Symptomatic uterine fibroid menstrual bleeding EXAM: UTERINE FIBROID EMBOLIZATION Date:  04/14/2020 04/14/2020 12:48 pm Radiologist:  Jerilynn Mages. Daryll Brod, MD Guidance:  Ultrasound fluoroscopic MEDICATIONS: Ancef 2 g. The antibiotic was administered within 1 hour of the procedure ANESTHESIA/SEDATION: Fentanyl 200 mcg IV; Versed 5 0 mg IV, 10 mg hydralazine Moderate Sedation Time:  58 minutes The patient was continuously monitored during the procedure by the interventional radiology nurse under my direct supervision. CONTRAST:  90mL OMNIPAQUE IOHEXOL 300 MG/ML SOLN, 30mL OMNIPAQUE IOHEXOL 300 MG/ML SOLN FLUOROSCOPY TIME:  Fluoroscopy Time: 15 minutes 0 seconds (2,600 mGy). COMPLICATIONS: None immediate. PROCEDURE: Informed consent was obtained from the patient following explanation of the procedure, risks, benefits and alternatives. The patient understands, agrees and consents for the procedure. All questions were addressed. A time out was performed. Maximal barrier sterile technique utilized including caps, mask, sterile gowns, sterile gloves, large sterile drape, hand hygiene, and betadine prep. Under sterile conditions and local anesthesia, bilateralcommon femoral artery access was performed with micropuncture needles. Ultrasound was utilized for access. Images were obtained for documentation the patent common femoral arteries. Guidewires were advanced followed insertion of bilateral 5 French sheaths. C2 catheter was  utilized to select the contralateral internal iliac artery. Selective left internal iliac angiogram was performed. The tortuous left uterine artery was identified. Selective catheterization was performed of the left uterine artery with a  microcatheter and micro guide wire. A selective left uterine angiogram was performed. This demonstrated patency of the left uterine artery. Mild diffuse hypervascularity of the enlarged fibroid uterus. Access was adequate for embolization. For embolization, 1.5 vials of 500 - 773micron Embospheres and 3 vialsof 700-900 micron Embospheres were injected into the left uterine artery. Post embolization angiogram confirms complete stasis of the left uterine vascular territory. Microcatheter was removed. C2 catheter also utilized to select the right internal iliac artery. Selective right internal iliac angiogram was performed. The patent right uterine artery was identified. For selective catheterization, the micro catheter and guidewire were utilized to select the right uterine artery. Selective right uterine angiogram was performed. This demonstrated patency of the right uterine artery. Catheter position was safe for embolization. Embolization was performed to complete stasis with injection of 1.5 vials of 500-700 micron Embospheres and 1 vialof 700-900 micron Embospheres. Post embolization angiogram confirms complete stasis of the right uterine vascular territory. Microcatheter and C2 catheters were removed. Injection of the bilateralcommon femoral artery sheath confirms access is adequate for ExoSeal hemostasis was obtained with ExoSeal bilaterally. No immediate complication. Peripheral pedal pulses are normal +2. The patient tolerated the procedure well. No immediate complication. IMPRESSION: Successful bilateral uterine artery embolization (U F E) Electronically Signed   By: Jerilynn Mages.  Eduar Kumpf M.D.   On: 04/14/2020 13:45   IR EMBO TUMOR ORGAN ISCHEMIA INFARCT INC GUIDE ROADMAPPING  Result  Date: 04/14/2020 INDICATION: Symptomatic uterine fibroid menstrual bleeding EXAM: UTERINE FIBROID EMBOLIZATION Date:  04/14/2020 04/14/2020 12:48 pm Radiologist:  Jerilynn Mages. Daryll Brod, MD Guidance:  Ultrasound fluoroscopic MEDICATIONS: Ancef 2 g. The antibiotic was administered within 1 hour of the procedure ANESTHESIA/SEDATION: Fentanyl 200 mcg IV; Versed 5 0 mg IV, 10 mg hydralazine Moderate Sedation Time:  58 minutes The patient was continuously monitored during the procedure by the interventional radiology nurse under my direct supervision. CONTRAST:  73mL OMNIPAQUE IOHEXOL 300 MG/ML SOLN, 92mL OMNIPAQUE IOHEXOL 300 MG/ML SOLN FLUOROSCOPY TIME:  Fluoroscopy Time: 15 minutes 0 seconds (2,600 mGy). COMPLICATIONS: None immediate. PROCEDURE: Informed consent was obtained from the patient following explanation of the procedure, risks, benefits and alternatives. The patient understands, agrees and consents for the procedure. All questions were addressed. A time out was performed. Maximal barrier sterile technique utilized including caps, mask, sterile gowns, sterile gloves, large sterile drape, hand hygiene, and betadine prep. Under sterile conditions and local anesthesia, bilateralcommon femoral artery access was performed with micropuncture needles. Ultrasound was utilized for access. Images were obtained for documentation the patent common femoral arteries. Guidewires were advanced followed insertion of bilateral 5 French sheaths. C2 catheter was utilized to select the contralateral internal iliac artery. Selective left internal iliac angiogram was performed. The tortuous left uterine artery was identified. Selective catheterization was performed of the left uterine artery with a microcatheter and micro guide wire. A selective left uterine angiogram was performed. This demonstrated patency of the left uterine artery. Mild diffuse hypervascularity of the enlarged fibroid uterus. Access was adequate for embolization. For  embolization, 1.5 vials of 500 - 744micron Embospheres and 3 vialsof 700-900 micron Embospheres were injected into the left uterine artery. Post embolization angiogram confirms complete stasis of the left uterine vascular territory. Microcatheter was removed. C2 catheter also utilized to select the right internal iliac artery. Selective right internal iliac angiogram was performed. The patent right uterine artery was identified. For selective catheterization, the micro catheter and guidewire were utilized to select the right uterine artery. Selective right uterine angiogram was performed. This  demonstrated patency of the right uterine artery. Catheter position was safe for embolization. Embolization was performed to complete stasis with injection of 1.5 vials of 500-700 micron Embospheres and 1 vialof 700-900 micron Embospheres. Post embolization angiogram confirms complete stasis of the right uterine vascular territory. Microcatheter and C2 catheters were removed. Injection of the bilateralcommon femoral artery sheath confirms access is adequate for ExoSeal hemostasis was obtained with ExoSeal bilaterally. No immediate complication. Peripheral pedal pulses are normal +2. The patient tolerated the procedure well. No immediate complication. IMPRESSION: Successful bilateral uterine artery embolization (U F E) Electronically Signed   By: Jerilynn Mages.  Mera Gunkel M.D.   On: 04/14/2020 13:45    Labs:  CBC: Recent Labs    04/14/20 0845  WBC 3.7*  HGB 10.7*  HCT 33.2*  PLT 319    COAGS: Recent Labs    04/14/20 0845  INR 0.9  APTT 30    BMP: Recent Labs    04/14/20 0845  NA 139  K 3.7  CL 105  CO2 25  GLUCOSE 95  BUN 11  CALCIUM 8.6*  CREATININE 0.49  GFRNONAA >60  GFRAA >60    LIVER FUNCTION TESTS: No results for input(s): BILITOT, AST, ALT, ALKPHOS, PROT, ALBUMIN in the last 8760 hours.   Assessment and Plan:  1 month status post successful uterine fibroid embolization performed at [facility] 04/14/2020.  She is recovering very well and is back to work today.  No recent illness, fever, or abnormal discharge.  No significant residual pelvic pain or discomfort.  She already had a very short cycle following the procedure.  Plan: Outpatient follow-up at 3 months by telephone and at 6 months with an MRI.  Electronically Signed: Greggory Keen 05/07/2020, 10:47 AM   I spent a total of    25 Minutes in remote  clinical consultation, greater than 50% of which was counseling/coordinating care for this patient with symptomatic uterine fibroids and abnormal bleeding..    Visit type: Audio only (telephone). Audio (no video) only due to patient's lack of internet/smartphone capability. Alternative for in-person consultation at [facility], [address], [place], [place]. This visit type was conducted due to national recommendations for restrictions regarding the COVID-19 Pandemic (e.g. social distancing).  This format is felt to be most appropriate for this patient at this time.  All issues noted in this document were discussed and addressed.

## 2020-07-20 IMAGING — XA IR US GUIDE VASC ACCESS LEFT
5 series · 13 of 20 positions shown · IV contrast (IODINE)
Comparison: none

INDICATION: Symptomatic uterine fibroid menstrual bleeding

[Series 1: care body 4 · 3 of 20 frames shown (1 of 5)]
[frame 4/20]
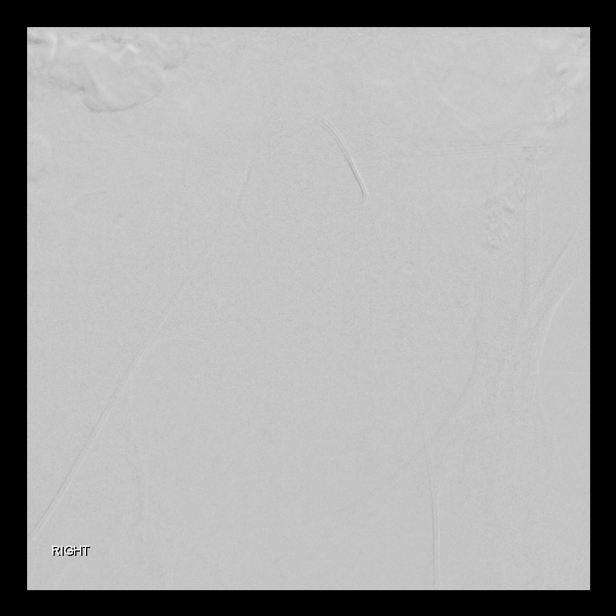
[frame 15/20]
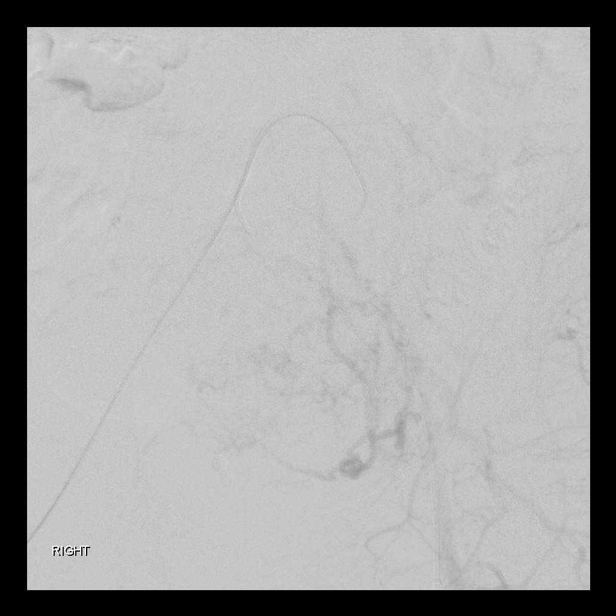
[frame 18/20]
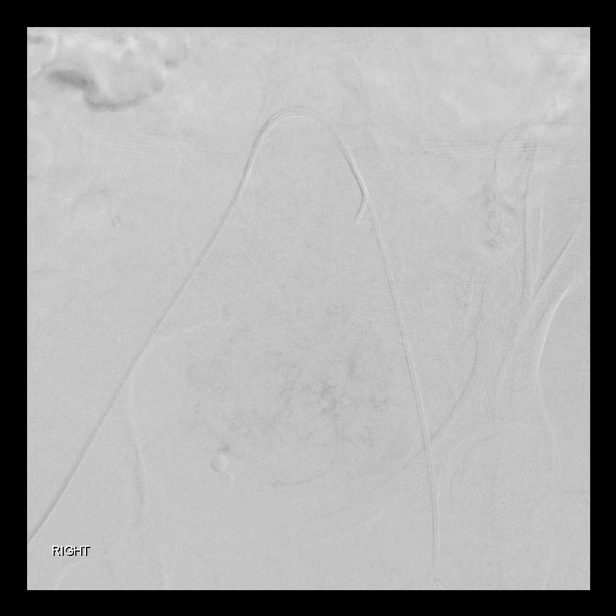

[Series 2: care body 4 · 2 of 21 frames shown (2 of 5)]
[frame 11/21]
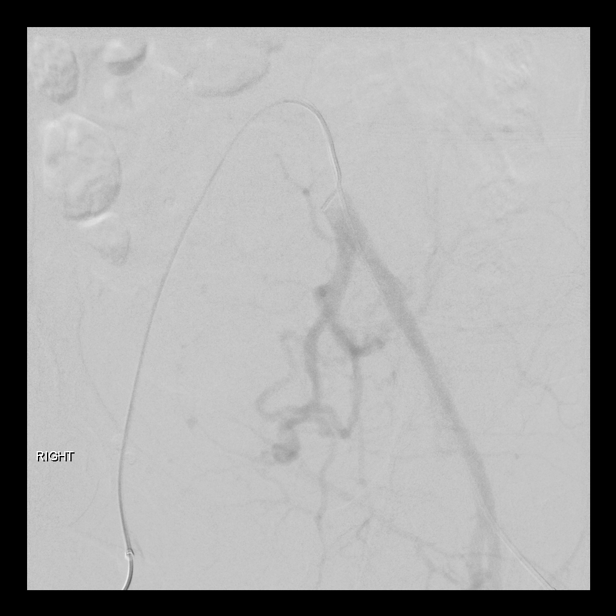
[frame 14/21]
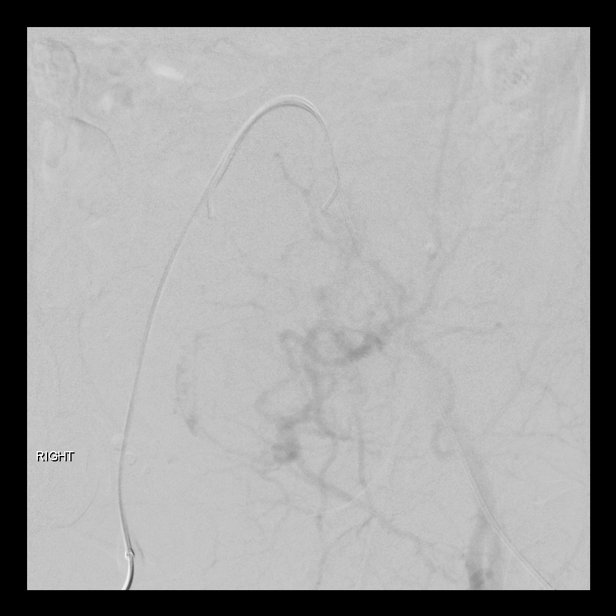

[Series 6: care body 4 · 3 of 29 frames shown (3 of 5)]
[frame 5/29]
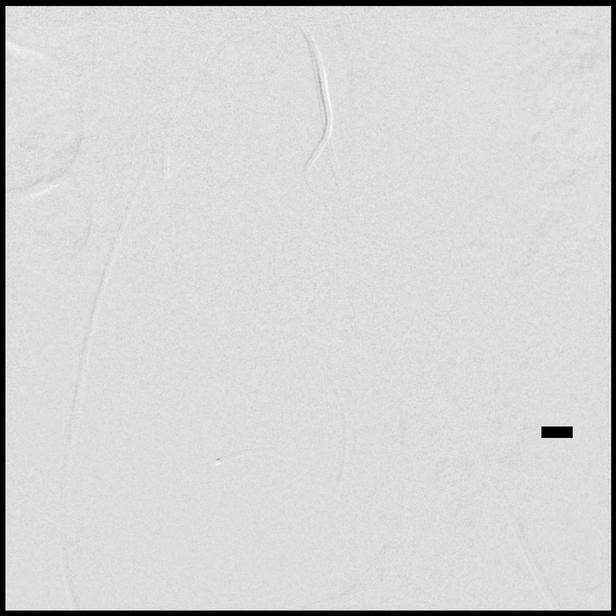
[frame 25/29]
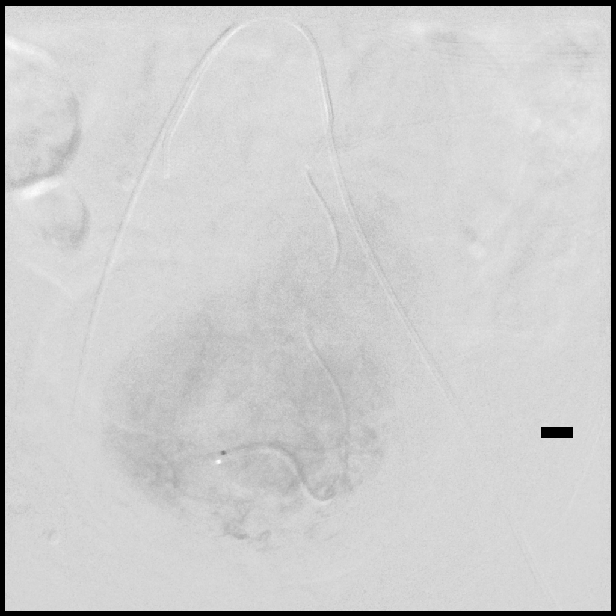
[frame 29/29]
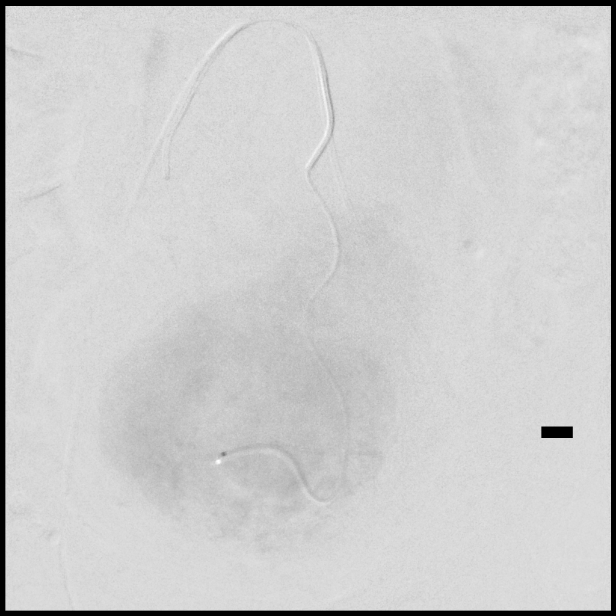

[Series 10: care body 4 · 2 of 63 frames shown (4 of 5)]
[frame 32/63]
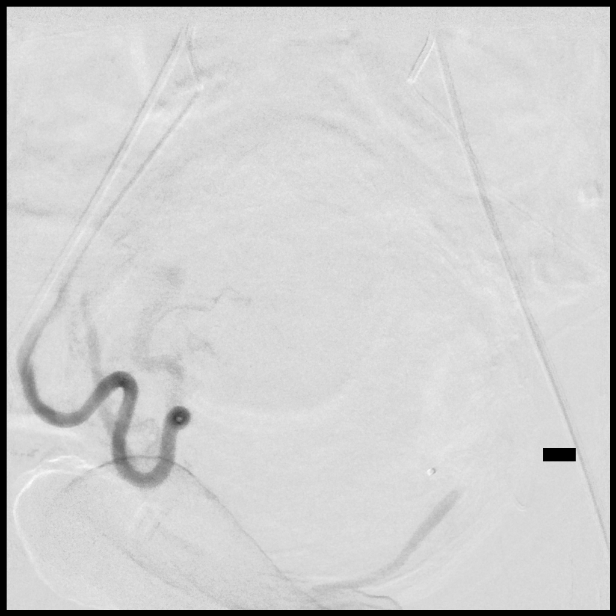
[frame 54/63]
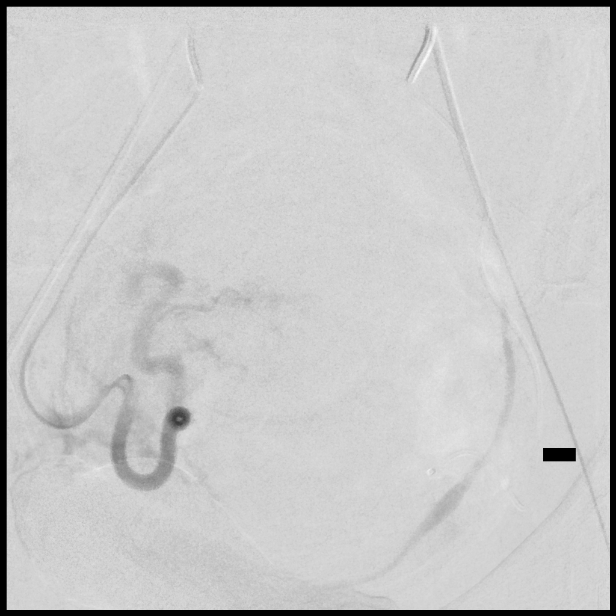

[Series 11: care body 4 · 3 of 52 frames shown (5 of 5)]
[frame 8/52]
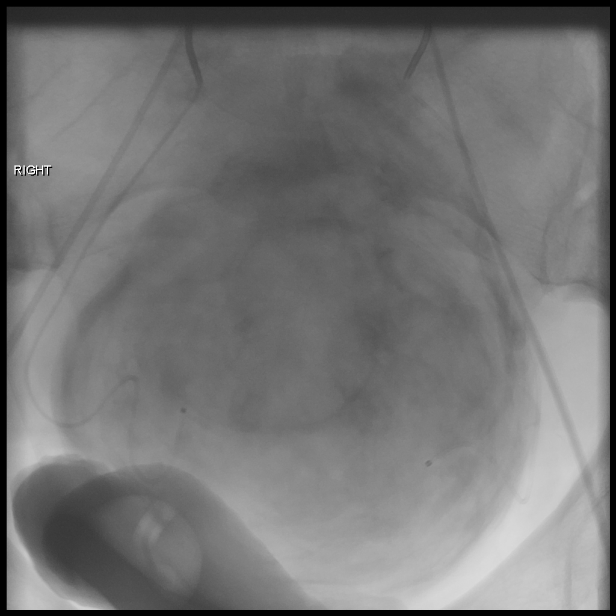
[frame 22/52]
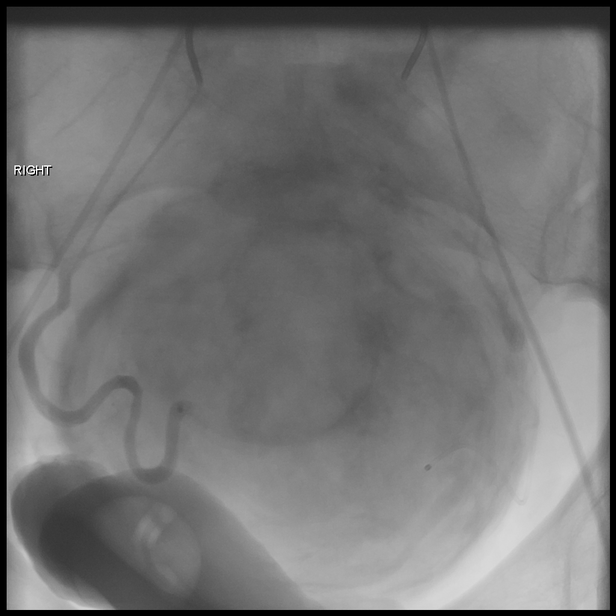
[frame 45/52]
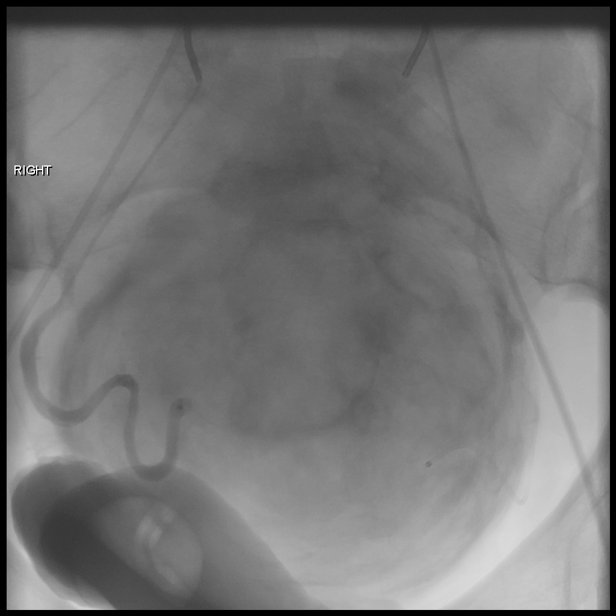

[13 of 20 positions shown; findings below may reference images not displayed]

EXAM:
UTERINE FIBROID EMBOLIZATION

Radiologist:  Jumper, Blain

Guidance:  Ultrasound fluoroscopic

MEDICATIONS:
Ancef 2 g. The antibiotic was administered within 1 hour of the
procedure

ANESTHESIA/SEDATION:
Fentanyl 200 mcg IV; Versed 5 0 mg IV, 10 mg hydralazine

Moderate Sedation Time:  58 minutes

The patient was continuously monitored during the procedure by the
interventional radiology nurse under my direct supervision.

CONTRAST:  70mL OMNIPAQUE IOHEXOL 300 MG/ML SOLN, 70mL OMNIPAQUE
IOHEXOL 300 MG/ML SOLN

FLUOROSCOPY TIME:  Fluoroscopy Time: 15 minutes 0 seconds (2,600
mGy).

COMPLICATIONS:
None immediate.

PROCEDURE:
Informed consent was obtained from the patient following explanation
of the procedure, risks, benefits and alternatives. The patient
understands, agrees and consents for the procedure. All questions
were addressed. A time out was performed.

Maximal barrier sterile technique utilized including caps, mask,
sterile gowns, sterile gloves, large sterile drape, hand hygiene,
and betadine prep.

Under sterile conditions and local anesthesia, bilateralcommon
femoral artery access was performed with micropuncture needles.
Ultrasound was utilized for access. Images were obtained for
documentation the patent common femoral arteries. Guidewires were
advanced followed insertion of bilateral 5 French sheaths.

C2 catheter was utilized to select the contralateral internal iliac
artery. Selective left internal iliac angiogram was performed. The
tortuous left uterine artery was identified. Selective
catheterization was performed of the left uterine artery with a
microcatheter and micro guide wire. A selective left uterine
angiogram was performed. This demonstrated patency of the left
uterine artery. Mild diffuse hypervascularity of the enlarged
fibroid uterus. Access was adequate for embolization. For
embolization, 1.5 vials of 500 - 999micron Embospheres and 3 vialsof
700-900 micron Embospheres were injected into the left uterine
artery. Post embolization angiogram confirms complete stasis of the
left uterine vascular territory. Microcatheter was removed.

C2 catheter also utilized to select the right internal iliac artery.
Selective right internal iliac angiogram was performed. The patent
right uterine artery was identified. For selective catheterization,
the micro catheter and guidewire were utilized to select the right
uterine artery. Selective right uterine angiogram was performed.
This demonstrated patency of the right uterine artery. Catheter
position was safe for embolization. Embolization was performed to
complete stasis with injection of 1.5 vials of 500-700 micron
Embospheres and 1 vialof 700-900 micron Embospheres. Post
embolization angiogram confirms complete stasis of the right uterine
vascular territory.

Microcatheter and C2 catheters were removed.

Injection of the bilateralcommon femoral artery sheath confirms
access is adequate for ExoSeal hemostasis was obtained with ExoSeal
bilaterally. No immediate complication. Peripheral pedal pulses are
normal +2.

The patient tolerated the procedure well. No immediate complication.
IMPRESSION: Successful bilateral uterine artery embolization (U F E)

## 2020-07-20 IMAGING — US IR ANGIO/PELVIC SELECTIVE EA VESSEL
1 series · 2 of 2 positions shown · non-contrast
Comparison: none

INDICATION: Symptomatic uterine fibroid menstrual bleeding

[Series 1: (id) · 2 of 2 slices shown]
[im 1/2]
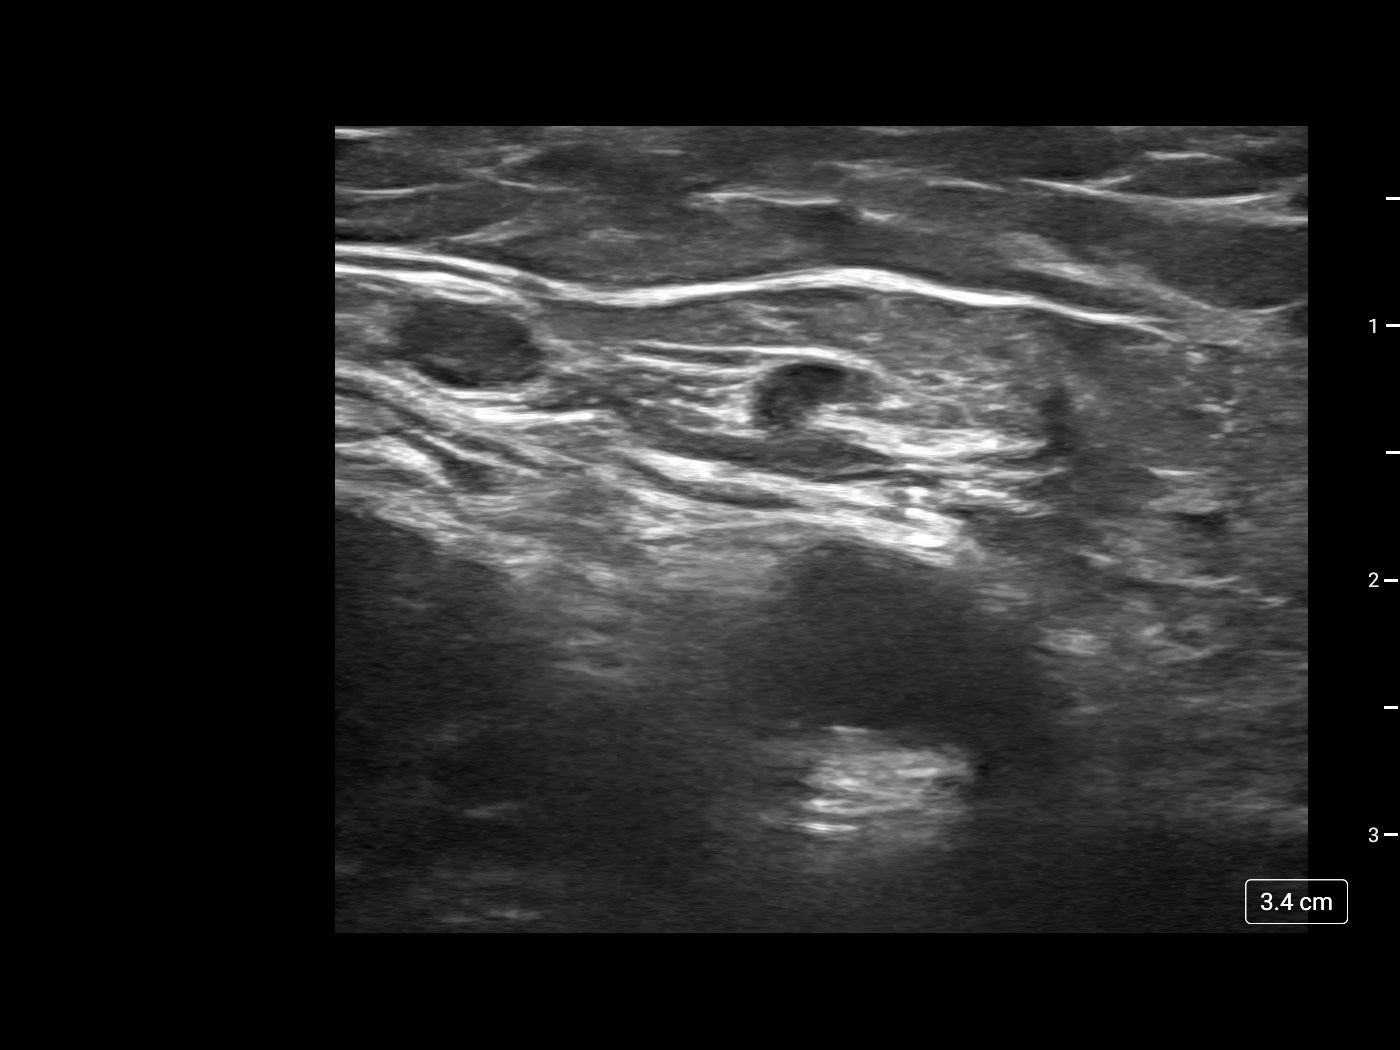
[im 2/2]
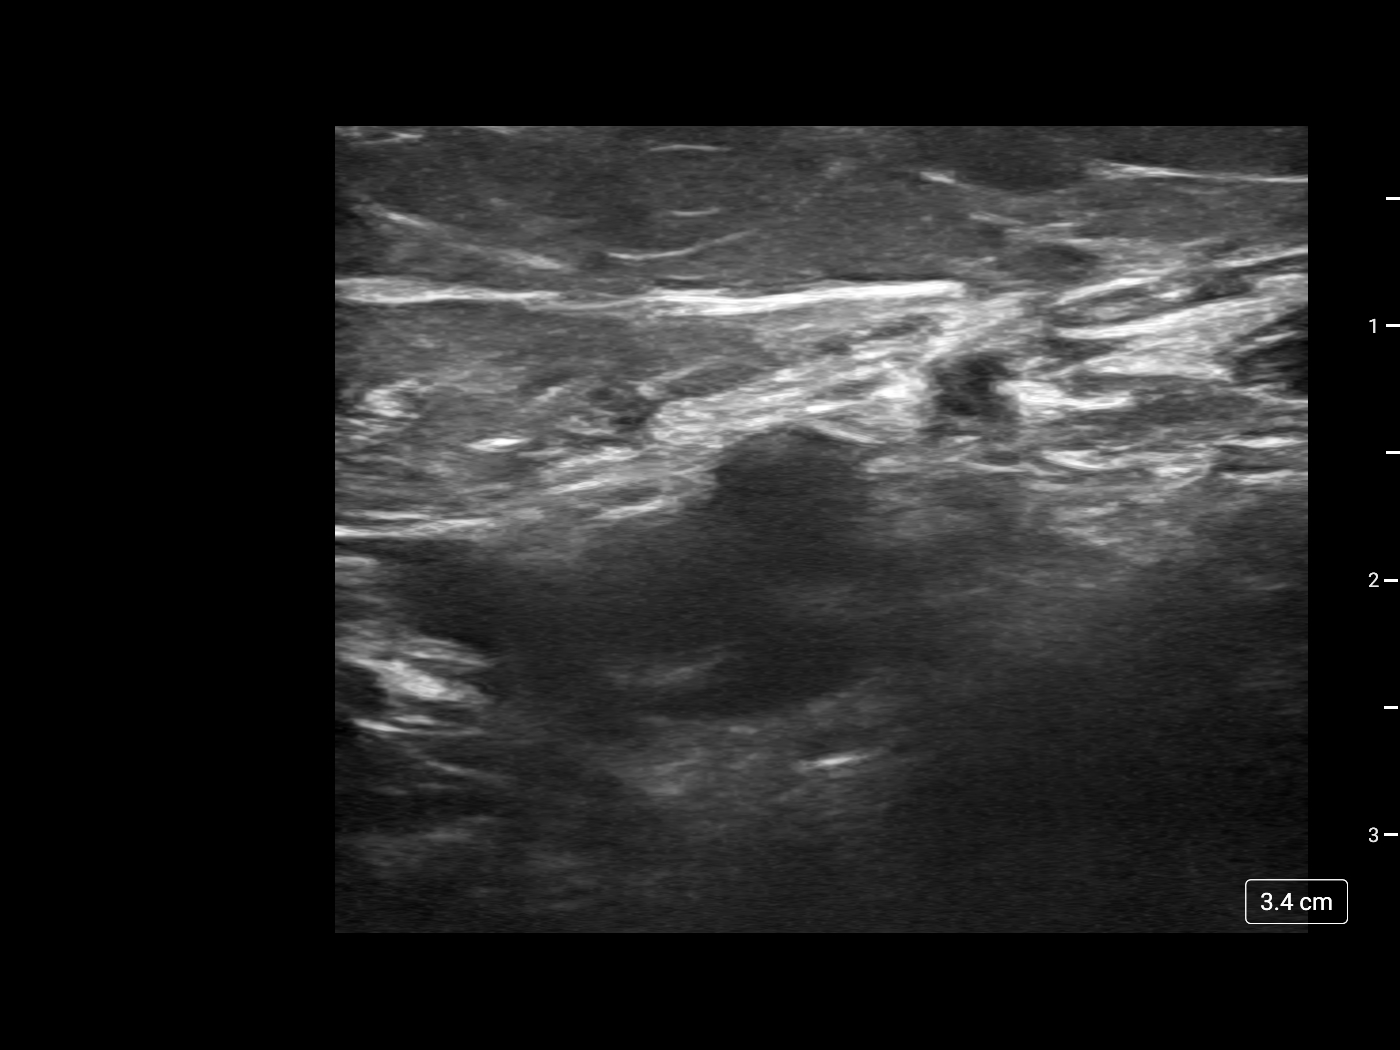

[2 of 2 positions shown; findings below may reference images not displayed]

EXAM:
UTERINE FIBROID EMBOLIZATION

Radiologist:  Jumper, Blain

Guidance:  Ultrasound fluoroscopic

MEDICATIONS:
Ancef 2 g. The antibiotic was administered within 1 hour of the
procedure

ANESTHESIA/SEDATION:
Fentanyl 200 mcg IV; Versed 5 0 mg IV, 10 mg hydralazine

Moderate Sedation Time:  58 minutes

The patient was continuously monitored during the procedure by the
interventional radiology nurse under my direct supervision.

CONTRAST:  70mL OMNIPAQUE IOHEXOL 300 MG/ML SOLN, 70mL OMNIPAQUE
IOHEXOL 300 MG/ML SOLN

FLUOROSCOPY TIME:  Fluoroscopy Time: 15 minutes 0 seconds (2,600
mGy).

COMPLICATIONS:
None immediate.

PROCEDURE:
Informed consent was obtained from the patient following explanation
of the procedure, risks, benefits and alternatives. The patient
understands, agrees and consents for the procedure. All questions
were addressed. A time out was performed.

Maximal barrier sterile technique utilized including caps, mask,
sterile gowns, sterile gloves, large sterile drape, hand hygiene,
and betadine prep.

Under sterile conditions and local anesthesia, bilateralcommon
femoral artery access was performed with micropuncture needles.
Ultrasound was utilized for access. Images were obtained for
documentation the patent common femoral arteries. Guidewires were
advanced followed insertion of bilateral 5 French sheaths.

C2 catheter was utilized to select the contralateral internal iliac
artery. Selective left internal iliac angiogram was performed. The
tortuous left uterine artery was identified. Selective
catheterization was performed of the left uterine artery with a
microcatheter and micro guide wire. A selective left uterine
angiogram was performed. This demonstrated patency of the left
uterine artery. Mild diffuse hypervascularity of the enlarged
fibroid uterus. Access was adequate for embolization. For
embolization, 1.5 vials of 500 - 999micron Embospheres and 3 vialsof
700-900 micron Embospheres were injected into the left uterine
artery. Post embolization angiogram confirms complete stasis of the
left uterine vascular territory. Microcatheter was removed.

C2 catheter also utilized to select the right internal iliac artery.
Selective right internal iliac angiogram was performed. The patent
right uterine artery was identified. For selective catheterization,
the micro catheter and guidewire were utilized to select the right
uterine artery. Selective right uterine angiogram was performed.
This demonstrated patency of the right uterine artery. Catheter
position was safe for embolization. Embolization was performed to
complete stasis with injection of 1.5 vials of 500-700 micron
Embospheres and 1 vialof 700-900 micron Embospheres. Post
embolization angiogram confirms complete stasis of the right uterine
vascular territory.

Microcatheter and C2 catheters were removed.

Injection of the bilateralcommon femoral artery sheath confirms
access is adequate for ExoSeal hemostasis was obtained with ExoSeal
bilaterally. No immediate complication. Peripheral pedal pulses are
normal +2.

The patient tolerated the procedure well. No immediate complication.
IMPRESSION: Successful bilateral uterine artery embolization (U F E)

## 2020-10-14 ENCOUNTER — Other Ambulatory Visit: Payer: Self-pay | Admitting: Interventional Radiology

## 2020-10-14 DIAGNOSIS — D250 Submucous leiomyoma of uterus: Secondary | ICD-10-CM

## 2020-11-03 ENCOUNTER — Other Ambulatory Visit: Payer: Self-pay | Admitting: Interventional Radiology

## 2020-11-03 ENCOUNTER — Other Ambulatory Visit: Payer: Self-pay

## 2020-11-03 ENCOUNTER — Ambulatory Visit (HOSPITAL_COMMUNITY)
Admission: RE | Admit: 2020-11-03 | Discharge: 2020-11-03 | Disposition: A | Discharge status: Home / Self Care | Payer: Managed Care, Other (non HMO) | Source: Ambulatory Visit | Attending: Interventional Radiology | Admitting: Interventional Radiology

## 2020-11-03 DIAGNOSIS — D250 Submucous leiomyoma of uterus: Secondary | ICD-10-CM

## 2020-11-10 ENCOUNTER — Other Ambulatory Visit: Payer: Self-pay

## 2020-11-10 ENCOUNTER — Ambulatory Visit
Admission: RE | Admit: 2020-11-10 | Discharge: 2020-11-10 | Disposition: A | Discharge status: Home / Self Care | Payer: Managed Care, Other (non HMO) | Source: Ambulatory Visit | Attending: Interventional Radiology | Admitting: Interventional Radiology

## 2020-11-10 DIAGNOSIS — D250 Submucous leiomyoma of uterus: Secondary | ICD-10-CM

## 2020-11-10 HISTORY — PX: IR RADIOLOGIST EVAL & MGMT: IMG5224

## 2020-11-10 NOTE — Progress Notes (Signed)
Patient ID: Erin Bates, female   DOB: 03/05/1971, 50 y.o.   MRN: [id number]       Chief Complaint:  Uterine fibroid   Referring Physician(s): Dr. Cherly Hensen  History of Present Illness: [name] is a 50 y.o. female with symptomatic uterine fibroids and abnormal menstrual bleeding.  She is now 45-month status post uterine fibroid embolization performed at [facility] 04/14/2020.  She had a 78-month follow-up MRI in the last month.  She was to complete the contrast portion of the MRI scan because of anxiety and claustrophobia.  Therefore the exam was incomplete.  However, the noncontrast pelvic MRI does confirm overall reduction in the uterine volume as well as decrease in size of all of the fibroids following treatment.  Within the limits of a noncontrast exam, this appears to represent a favorable response to embolization.  Symptomatically she has improved.  Her menstrual cycle is now described as a very light cycle.  No passes of blood clots anymore.  No intra-.  Bleeding.  Her cycle can still last 6 days but again is very light.  Energy level has improved as well.  She currently does not take any pain medicines.  Resolution of the previous pelvic pain and cramping.  She has been back to work for several months and doing very well.  Overall, her clinical outcome is concordant with the MRI findings.  No past medical history on file.  Past Surgical History:  Procedure Laterality Date  . IR ANGIOGRAM PELVIS SELECTIVE OR SUPRASELECTIVE  04/14/2020  . IR ANGIOGRAM SELECTIVE EACH ADDITIONAL VESSEL  04/14/2020  . IR EMBO TUMOR ORGAN ISCHEMIA INFARCT INC GUIDE ROADMAPPING  04/14/2020  . IR RADIOLOGIST EVAL & MGMT  02/18/2020  . IR RADIOLOGIST EVAL & MGMT  05/07/2020  . IR US GUIDE VASC ACCESS LEFT  04/14/2020  . IR US GUIDE VASC ACCESS RIGHT  04/14/2020    Allergies: Prednisone, Hydrochlorothiazide, and Lisinopril  Medications: Prior to Admission medications   Medication Sig Start Date End Date  Taking? Authorizing Provider  albuterol (VENTOLIN HFA) 108 (90 Base) MCG/ACT inhaler Inhale 1 puff into the lungs every 6 (six) hours as needed for wheezing or shortness of breath.  12/11/19   [provider]  cholecalciferol (VITAMIN D3) 25 MCG (1000 UNIT) tablet Take 1,000 Units by mouth daily. daily    [provider]  docusate sodium (COLACE) 100 MG capsule Take 1 capsule (100 mg total) by mouth 2 (two) times daily. 04/15/20   Gershon Crane, PA-C  HYDROcodone-acetaminophen (NORCO) 5-325 MG tablet Take 1 tablet by mouth every 6 (six) hours as needed for up to 20 doses for moderate pain. 04/15/20   Gershon Crane, PA-C  ibuprofen (ADVIL) 800 MG tablet Take 800 mg by mouth every 8 (eight) hours. 02/06/20   [provider]     No family history on file.  Social History   Socioeconomic History  . Marital status: Single    Spouse name: Not on file  . Number of children: Not on file  . Years of education: Not on file  . Highest education level: Not on file  Occupational History  . Not on file  Tobacco Use  . Smoking status: Never Smoker  . Smokeless tobacco: Never Used  Vaping Use  . Vaping Use: Never used  Substance and Sexual Activity  . Alcohol use: Never  . Drug use: Never  . Sexual activity: Not on file  Other Topics Concern  . Not on file  Social History Narrative  . Not on file   Social Determinants of Health   Financial Resource Strain: Not on file  Food Insecurity: Not on file  Transportation Needs: Not on file  Physical Activity: Not on file  Stress: Not on file  Social Connections: Not on file     Review of Systems  Review of Systems: A 12 point ROS discussed and pertinent positives are indicated in the HPI above.  All other systems are negative.  Physical Exam No direct physical exam was performed, telephone health visit only because of Covid pandemic Vital Signs: There were no vitals taken for this visit.  Imaging: MR  PELVIS WO CONTRAST  Result Date: 11/03/2020 CLINICAL DATA:  Submucosal leiomyoma post uterine fibroid embolization EXAM: MRI PELVIS WITHOUT CONTRAST TECHNIQUE: Multiplanar multisequence MR imaging of the pelvis was performed. No intravenous contrast was administered. COMPARISON:  Mar 11, 2020 MRI evaluation of the pelvis. FINDINGS: Urinary Tract: No distal ureteral dilation. Urinary bladder is decompressed limiting assessment. Bowel: Limited assessment of gastrointestinal tract without acute process. Vascular/Lymphatic: Vascular structures not well assessed given lack of intravenous contrast. Min Reproductive: Uterus measuring approximately 10 x 6.5 x 7.4 cm. The leiomyomata many subserosal show low T2 signal. The largest at the uterine fundus measuring approximately 3.2 x 4.0 x 3.7 cm (image 23, series 2) previously approximately 5 x 5.2 x 5.3 cm. Next largest subserosal leiomyoma at the uterine fundus anteriorly measuring on the sagittal plane approximately 3.8 x 3.6 cm. (Image 22, series 4) previously 5.6 x 4.1 cm in this plane. Another along the RIGHT uterus showing low T2 signal on today's study at 3.4 x 3.1 cm, previously 4.6 x 4.3 cm also measured in the sagittal plane. All of these leiomyomata display intermediate to slightly increased T1 signal. Post-contrast assessment was not performed as the patient was unable to proceed with the examination. What appears to represent a partially submucosal leiomyoma in the mid uterus, partially in communication with the endometrial canal over a segment approximately 3 cm in length (image 20, series 4) this leiomyoma displays mixed pattern of T2 signal that is more similar to pre embolization imaging than is the signal of other leiomyomata in the uterus. This is predominantly an intramural leiomyoma with small submucosal component as outlined above. This measures 5.1 x 5.3 x 5.2 cm as compared to 6.9 x 6.2 x 7.1 cm. There is mild distension of the endometrial canal  above the leiomyoma that displays increased T1 signal. Mildly increased T1 signal throughout this leiomyoma. RIGHT ovary with small cysts. Also with a T1 hyperintense focus within the ovary (image 8 of series 8) 5 mm. T1 hyperintense serpiginous area along the LEFT ovary this area measures approximately 1.9 x 1.5 cm on image 6 of series 8, enlarged compared to the previous exam where was quite subtle and had an appearance that could have mimicked flow related artifact within adjacent vessels but is seen in hindsight. Serpiginous area of T1 hyperintensity tracking along the RIGHT broad ligament and fallopian tube as well. Other:  Small volume ascites in the pelvis Musculoskeletal: No suspicious bone lesions identified. IMPRESSION: 1. Numerous leiomyomata in the uterus, dominant lesions have decreased in size as described. Contrast not administered at the patient's request and the patient was unable to complete the examination due to claustrophobia. 2. Submucosal leiomyoma with approximately 3 cm interface with the endometrial canal potentially partially necrotic in this area correlate with any worsening bleeding. 3. Changes in signal include increased T1 signal  and overall decreased T2 signal perhaps reflecting hemorrhagic transformation post embolization. 4. Small amount of blood in a mildly distended endometrial canal above the partially submucosal leiomyoma. 5. Potential bilateral small hematosalpinx. 6. Subtle focus of T1 hyperintensity in the RIGHT ovary may represent a small hemorrhagic cyst. Would also correlate with any history of endometriosis. Electronically Signed   By: Zetta Bills M.D.   On: 11/03/2020 20:16    Labs:  CBC: Recent Labs    04/14/20 0845  WBC 3.7*  HGB 10.7*  HCT 33.2*  PLT 319    COAGS: Recent Labs    04/14/20 0845  INR 0.9  APTT 30    BMP: Recent Labs    04/14/20 0845  NA 139  K 3.7  CL 105  CO2 25  GLUCOSE 95  BUN 11  CALCIUM 8.6*  CREATININE 0.49   GFRNONAA >60  GFRAA >60    LIVER FUNCTION TESTS: No results for input(s): BILITOT, AST, ALT, ALKPHOS, PROT, ALBUMIN in the last 8760 hours.   Assessment and Plan:  69-month status post uterine fibroid embolization for symptomatic uterine fibroids.  Overall she is improved clinically with resolution of the abnormal menstrual bleeding as well as improvement in the associated pelvic pain, cramping, and fatigue.  Noncontrast MRI confirms overall decrease in uterine volume as well as decrease in the fibroids, as detailed above.  Plan: Continue annual GYN exam and evaluation with Dr. Garwin Brothers.  Follow-up with interventional as needed.   Electronically Signed: Greggory Keen 11/10/2020, 2:13 PM   I spent a total of    25 Minutes in remote  clinical consultation, greater than 50% of which was counseling/coordinating care for this patient with symptomatic uterine fibroids status post UFE.    Visit type: Audio only (telephone). Audio (no video) only due to patient's lack of internet/smartphone capability. Alternative for in-person consultation at [facility], [address], [place], [place]. This visit type was conducted due to national recommendations for restrictions regarding the COVID-19 Pandemic (e.g. social distancing).  This format is felt to be most appropriate for this patient at this time.  All issues noted in this document were discussed and addressed.

## 2020-11-11 ENCOUNTER — Encounter: Payer: Self-pay | Admitting: *Deleted

## 2021-08-09 ENCOUNTER — Other Ambulatory Visit (HOSPITAL_COMMUNITY): Payer: Self-pay

## 2021-08-09 MED ORDER — LEVOCETIRIZINE DIHYDROCHLORIDE 5 MG PO TABS
5.0000 mg | ORAL_TABLET | Freq: Every evening | ORAL | 1 refills | Status: DC
Start: 2021-08-09 — End: 2022-02-14
  Filled 2021-08-09: qty 90, 90d supply, fill #0
  Filled 2021-11-11: qty 90, 90d supply, fill #1

## 2021-08-09 MED ORDER — AMLODIPINE BESYLATE 5 MG PO TABS
5.0000 mg | ORAL_TABLET | Freq: Every day | ORAL | 0 refills | Status: DC
Start: 2021-05-13 — End: 2021-11-11
  Filled 2021-08-09: qty 90, 90d supply, fill #0

## 2021-10-13 DIAGNOSIS — R7303 Prediabetes: Secondary | ICD-10-CM | POA: Diagnosis not present

## 2021-10-13 DIAGNOSIS — E782 Mixed hyperlipidemia: Secondary | ICD-10-CM | POA: Diagnosis not present

## 2021-10-13 DIAGNOSIS — E559 Vitamin D deficiency, unspecified: Secondary | ICD-10-CM | POA: Diagnosis not present

## 2021-10-13 DIAGNOSIS — I10 Essential (primary) hypertension: Secondary | ICD-10-CM | POA: Diagnosis not present

## 2021-10-19 ENCOUNTER — Other Ambulatory Visit (HOSPITAL_BASED_OUTPATIENT_CLINIC_OR_DEPARTMENT_OTHER): Payer: Self-pay

## 2021-10-19 DIAGNOSIS — Z0000 Encounter for general adult medical examination without abnormal findings: Secondary | ICD-10-CM | POA: Diagnosis not present

## 2021-10-19 DIAGNOSIS — J4520 Mild intermittent asthma, uncomplicated: Secondary | ICD-10-CM | POA: Diagnosis not present

## 2021-10-19 MED ORDER — ALBUTEROL SULFATE HFA 108 (90 BASE) MCG/ACT IN AERS
INHALATION_SPRAY | RESPIRATORY_TRACT | 0 refills | Status: AC
  Filled 2021-10-19 – 2021-11-11 (×2): qty 18, 17d supply, fill #0
  Filled 2021-11-11: qty 18, 25d supply, fill #0
  Filled 2021-11-11: qty 18, 17d supply, fill #0

## 2021-11-04 ENCOUNTER — Other Ambulatory Visit (HOSPITAL_BASED_OUTPATIENT_CLINIC_OR_DEPARTMENT_OTHER): Payer: Self-pay

## 2021-11-11 ENCOUNTER — Other Ambulatory Visit (HOSPITAL_COMMUNITY): Payer: Self-pay

## 2021-11-11 MED ORDER — AMLODIPINE BESYLATE 5 MG PO TABS
5.0000 mg | ORAL_TABLET | Freq: Every day | ORAL | 0 refills | Status: DC
  Filled 2021-11-11: qty 90, 90d supply, fill #0

## 2021-11-16 DIAGNOSIS — Z1231 Encounter for screening mammogram for malignant neoplasm of breast: Secondary | ICD-10-CM | POA: Diagnosis not present

## 2022-01-21 ENCOUNTER — Other Ambulatory Visit (HOSPITAL_COMMUNITY): Payer: Self-pay

## 2022-01-21 MED ORDER — VALACYCLOVIR HCL 1 G PO TABS
1.0000 g | ORAL_TABLET | Freq: Two times a day (BID) | ORAL | 0 refills | Status: AC
  Filled 2022-01-21 – 2022-04-29 (×2): qty 14, 7d supply, fill #0

## 2022-01-31 ENCOUNTER — Other Ambulatory Visit (HOSPITAL_COMMUNITY): Payer: Self-pay

## 2022-02-14 ENCOUNTER — Other Ambulatory Visit (HOSPITAL_COMMUNITY): Payer: Self-pay

## 2022-02-14 MED ORDER — AMLODIPINE BESYLATE 5 MG PO TABS
5.0000 mg | ORAL_TABLET | Freq: Every day | ORAL | 0 refills | Status: DC
  Filled 2022-02-14: qty 90, 90d supply, fill #0

## 2022-02-14 MED ORDER — LEVOCETIRIZINE DIHYDROCHLORIDE 5 MG PO TABS
5.0000 mg | ORAL_TABLET | Freq: Every evening | ORAL | 1 refills | Status: DC
  Filled 2022-02-14: qty 90, 90d supply, fill #0
  Filled 2022-05-04 – 2022-05-13 (×2): qty 90, 90d supply, fill #1

## 2022-02-15 ENCOUNTER — Other Ambulatory Visit (HOSPITAL_COMMUNITY): Payer: Self-pay

## 2022-02-15 MED ORDER — AMLODIPINE BESYLATE 5 MG PO TABS
5.0000 mg | ORAL_TABLET | Freq: Every day | ORAL | 0 refills | Status: DC
  Filled 2022-02-15 – 2022-05-13 (×3): qty 90, 90d supply, fill #0

## 2022-02-16 DIAGNOSIS — I10 Essential (primary) hypertension: Secondary | ICD-10-CM | POA: Diagnosis not present

## 2022-03-01 DIAGNOSIS — Z1283 Encounter for screening for malignant neoplasm of skin: Secondary | ICD-10-CM | POA: Diagnosis not present

## 2022-03-01 DIAGNOSIS — R0989 Other specified symptoms and signs involving the circulatory and respiratory systems: Secondary | ICD-10-CM | POA: Diagnosis not present

## 2022-03-01 DIAGNOSIS — R7303 Prediabetes: Secondary | ICD-10-CM | POA: Diagnosis not present

## 2022-03-01 DIAGNOSIS — R0982 Postnasal drip: Secondary | ICD-10-CM | POA: Diagnosis not present

## 2022-03-01 DIAGNOSIS — I10 Essential (primary) hypertension: Secondary | ICD-10-CM | POA: Diagnosis not present

## 2022-03-01 DIAGNOSIS — J309 Allergic rhinitis, unspecified: Secondary | ICD-10-CM | POA: Diagnosis not present

## 2022-03-22 DIAGNOSIS — Z01419 Encounter for gynecological examination (general) (routine) without abnormal findings: Secondary | ICD-10-CM | POA: Diagnosis not present

## 2022-03-22 DIAGNOSIS — I10 Essential (primary) hypertension: Secondary | ICD-10-CM | POA: Diagnosis not present

## 2022-03-22 DIAGNOSIS — D259 Leiomyoma of uterus, unspecified: Secondary | ICD-10-CM | POA: Diagnosis not present

## 2022-03-22 DIAGNOSIS — N923 Ovulation bleeding: Secondary | ICD-10-CM | POA: Diagnosis not present

## 2022-03-28 ENCOUNTER — Other Ambulatory Visit (HOSPITAL_COMMUNITY): Payer: Self-pay

## 2022-03-28 MED ORDER — CHLORHEXIDINE GLUCONATE 0.12 % MT SOLN
OROMUCOSAL | 0 refills | Status: DC
  Filled 2022-03-28: qty 473, 16d supply, fill #0

## 2022-04-13 DIAGNOSIS — R430 Anosmia: Secondary | ICD-10-CM | POA: Diagnosis not present

## 2022-04-13 DIAGNOSIS — J310 Chronic rhinitis: Secondary | ICD-10-CM | POA: Diagnosis not present

## 2022-04-14 ENCOUNTER — Other Ambulatory Visit (HOSPITAL_COMMUNITY): Payer: Self-pay

## 2022-04-14 MED ORDER — NAPROXEN 500 MG PO TABS
500.0000 mg | ORAL_TABLET | Freq: Two times a day (BID) | ORAL | 0 refills | Status: AC
  Filled 2022-04-14: qty 10, 5d supply, fill #0

## 2022-04-14 MED ORDER — CHLORHEXIDINE GLUCONATE 0.12 % MT SOLN
OROMUCOSAL | 0 refills | Status: AC
  Filled 2022-04-14: qty 946, 32d supply, fill #0

## 2022-04-20 DIAGNOSIS — D251 Intramural leiomyoma of uterus: Secondary | ICD-10-CM | POA: Diagnosis not present

## 2022-04-20 DIAGNOSIS — N939 Abnormal uterine and vaginal bleeding, unspecified: Secondary | ICD-10-CM | POA: Diagnosis not present

## 2022-04-20 DIAGNOSIS — J329 Chronic sinusitis, unspecified: Secondary | ICD-10-CM | POA: Diagnosis not present

## 2022-04-20 DIAGNOSIS — J310 Chronic rhinitis: Secondary | ICD-10-CM | POA: Diagnosis not present

## 2022-04-20 DIAGNOSIS — Z9889 Other specified postprocedural states: Secondary | ICD-10-CM | POA: Diagnosis not present

## 2022-04-29 ENCOUNTER — Other Ambulatory Visit (HOSPITAL_COMMUNITY): Payer: Self-pay

## 2022-05-02 ENCOUNTER — Other Ambulatory Visit (HOSPITAL_COMMUNITY): Payer: Self-pay

## 2022-05-03 DIAGNOSIS — L821 Other seborrheic keratosis: Secondary | ICD-10-CM | POA: Diagnosis not present

## 2022-05-03 DIAGNOSIS — D229 Melanocytic nevi, unspecified: Secondary | ICD-10-CM | POA: Diagnosis not present

## 2022-05-04 ENCOUNTER — Other Ambulatory Visit (HOSPITAL_COMMUNITY): Payer: Self-pay

## 2022-05-13 ENCOUNTER — Other Ambulatory Visit (HOSPITAL_COMMUNITY): Payer: Self-pay

## 2022-06-13 DIAGNOSIS — J329 Chronic sinusitis, unspecified: Secondary | ICD-10-CM | POA: Diagnosis not present

## 2022-06-22 DIAGNOSIS — R7303 Prediabetes: Secondary | ICD-10-CM | POA: Diagnosis not present

## 2022-06-22 DIAGNOSIS — E782 Mixed hyperlipidemia: Secondary | ICD-10-CM | POA: Diagnosis not present

## 2022-06-29 DIAGNOSIS — R7303 Prediabetes: Secondary | ICD-10-CM | POA: Diagnosis not present

## 2022-06-29 DIAGNOSIS — E782 Mixed hyperlipidemia: Secondary | ICD-10-CM | POA: Diagnosis not present

## 2022-06-29 DIAGNOSIS — I10 Essential (primary) hypertension: Secondary | ICD-10-CM | POA: Diagnosis not present

## 2022-07-15 ENCOUNTER — Other Ambulatory Visit (HOSPITAL_COMMUNITY): Payer: Self-pay

## 2022-07-19 ENCOUNTER — Other Ambulatory Visit (HOSPITAL_COMMUNITY): Payer: Self-pay

## 2022-07-19 MED ORDER — VALACYCLOVIR HCL 1 G PO TABS
1000.0000 mg | ORAL_TABLET | Freq: Two times a day (BID) | ORAL | 0 refills | Status: AC
  Filled 2022-07-19: qty 14, 7d supply, fill #0

## 2022-09-23 ENCOUNTER — Other Ambulatory Visit (HOSPITAL_COMMUNITY): Payer: Self-pay

## 2022-09-23 MED ORDER — LEVOCETIRIZINE DIHYDROCHLORIDE 5 MG PO TABS
5.0000 mg | ORAL_TABLET | Freq: Every evening | ORAL | 1 refills | Status: AC
  Filled 2022-09-23: qty 90, 90d supply, fill #0
  Filled 2023-01-05: qty 90, 90d supply, fill #1

## 2022-09-23 MED ORDER — VALACYCLOVIR HCL 1 G PO TABS
1000.0000 mg | ORAL_TABLET | Freq: Two times a day (BID) | ORAL | 0 refills | Status: AC
  Filled 2022-09-23: qty 14, 7d supply, fill #0

## 2022-10-06 ENCOUNTER — Other Ambulatory Visit (HOSPITAL_COMMUNITY): Payer: Self-pay

## 2022-10-06 MED ORDER — AMLODIPINE BESYLATE 5 MG PO TABS
5.0000 mg | ORAL_TABLET | Freq: Every day | ORAL | 0 refills | Status: DC
  Filled 2022-10-06: qty 90, 90d supply, fill #0

## 2022-10-19 DIAGNOSIS — E782 Mixed hyperlipidemia: Secondary | ICD-10-CM | POA: Diagnosis not present

## 2022-10-19 DIAGNOSIS — R7303 Prediabetes: Secondary | ICD-10-CM | POA: Diagnosis not present

## 2022-10-19 DIAGNOSIS — E559 Vitamin D deficiency, unspecified: Secondary | ICD-10-CM | POA: Diagnosis not present

## 2022-10-19 DIAGNOSIS — Z1329 Encounter for screening for other suspected endocrine disorder: Secondary | ICD-10-CM | POA: Diagnosis not present

## 2022-10-26 ENCOUNTER — Other Ambulatory Visit (HOSPITAL_COMMUNITY): Payer: Self-pay

## 2022-10-26 DIAGNOSIS — J329 Chronic sinusitis, unspecified: Secondary | ICD-10-CM | POA: Diagnosis not present

## 2022-10-26 DIAGNOSIS — R430 Anosmia: Secondary | ICD-10-CM | POA: Diagnosis not present

## 2022-10-26 MED ORDER — PREDNISONE 10 MG PO TABS
ORAL_TABLET | ORAL | 0 refills | Status: AC
  Filled 2022-10-26 (×2): qty 30, 12d supply, fill #0

## 2022-11-02 ENCOUNTER — Other Ambulatory Visit (HOSPITAL_COMMUNITY): Payer: Self-pay

## 2022-11-02 DIAGNOSIS — E669 Obesity, unspecified: Secondary | ICD-10-CM | POA: Diagnosis not present

## 2022-11-02 DIAGNOSIS — Z0000 Encounter for general adult medical examination without abnormal findings: Secondary | ICD-10-CM | POA: Diagnosis not present

## 2022-11-11 ENCOUNTER — Other Ambulatory Visit (HOSPITAL_COMMUNITY): Payer: Self-pay

## 2022-11-17 DIAGNOSIS — J329 Chronic sinusitis, unspecified: Secondary | ICD-10-CM | POA: Diagnosis not present

## 2022-11-17 DIAGNOSIS — R430 Anosmia: Secondary | ICD-10-CM | POA: Diagnosis not present

## 2022-11-17 DIAGNOSIS — J309 Allergic rhinitis, unspecified: Secondary | ICD-10-CM | POA: Diagnosis not present

## 2022-11-21 ENCOUNTER — Other Ambulatory Visit (HOSPITAL_COMMUNITY): Payer: Self-pay

## 2022-11-21 MED ORDER — DUPIXENT 300 MG/2ML ~~LOC~~ SOAJ
SUBCUTANEOUS | 2 refills | Status: DC
  Filled 2022-11-29: qty 4, 28d supply, fill #0

## 2022-11-29 ENCOUNTER — Other Ambulatory Visit (HOSPITAL_COMMUNITY): Payer: Self-pay

## 2022-11-30 ENCOUNTER — Other Ambulatory Visit (HOSPITAL_COMMUNITY): Payer: Self-pay

## 2022-11-30 ENCOUNTER — Ambulatory Visit: Payer: Managed Care, Other (non HMO) | Attending: Family Medicine | Admitting: Pharmacist

## 2022-11-30 DIAGNOSIS — Z7189 Other specified counseling: Secondary | ICD-10-CM

## 2022-11-30 MED ORDER — DUPIXENT 300 MG/2ML ~~LOC~~ SOAJ: SUBCUTANEOUS | 2 refills | Status: DC

## 2022-11-30 MED ORDER — DUPIXENT 300 MG/2ML ~~LOC~~ SOAJ
SUBCUTANEOUS | 2 refills | Status: DC
  Filled 2022-11-30: qty 4, fill #0
  Filled 2022-12-02: qty 4, 28d supply, fill #0
  Filled 2023-02-01: qty 4, 28d supply, fill #1
  Filled 2023-02-28: qty 4, 28d supply, fill #2

## 2022-11-30 NOTE — Progress Notes (Signed)
   S: Patient presents for review of their specialty medication therapy.  Patient is about to start taking Dupixent for chronic sinusitis/allergic rhinitis. Patient is managed by Dr. Megan Salon for this.   Adherence: has not yet started   Efficacy: has not yet started   Dosing: has not yet started   Dose adjustments: Renal: no dose adjustments (has not been studied) Hepatic: no dose adjustments (has not been studied)  Drug-drug interactions: none identified  Monitoring: S/sx of infection: none S/sx of hypersensitivity: none S/sx of ocular effects: none S/sx of eosinophilia/vasculitis: none  O:     Lab Results  Component Value Date   WBC 3.7 (L) 04/14/2020   HGB 10.7 (L) 04/14/2020   HCT 33.2 (L) 04/14/2020   MCV 87.1 04/14/2020   PLT 319 04/14/2020      Chemistry      Component Value Date/Time   NA 139 04/14/2020 0845   K 3.7 04/14/2020 0845   CL 105 04/14/2020 0845   CO2 25 04/14/2020 0845   BUN 11 04/14/2020 0845   CREATININE 0.49 04/14/2020 0845      Component Value Date/Time   CALCIUM 8.6 (L) 04/14/2020 0845       A/P: 1. Medication review: Patient is about to start on [place] for chronic rhinitis. Reviewed the medication with the patient, including the following: Dupixent is a monoclonal antibody used for the treatment of asthma or atopic dermatitis. Patient educated on purpose, proper use and potential adverse effects of Dupixent. Possible adverse effects include increased risk of infection, ocular effects, vasculitis/eosinophilia, and hypersensitivity reactions. Administer as a SubQ injection and rotate sites. Allow the medication to reach room temp prior to administration (45 mins for 300 mg syringe or 30 min for 200 mg syringe). Do not shake. Discard any unused portion. No recommendations for any changes.   Benard Halsted, PharmD, [place], [place] [phone]

## 2022-12-02 ENCOUNTER — Other Ambulatory Visit (HOSPITAL_COMMUNITY): Payer: Self-pay

## 2022-12-02 ENCOUNTER — Other Ambulatory Visit: Payer: Self-pay

## 2022-12-07 DIAGNOSIS — J320 Chronic maxillary sinusitis: Secondary | ICD-10-CM | POA: Diagnosis not present

## 2022-12-12 DIAGNOSIS — Z1231 Encounter for screening mammogram for malignant neoplasm of breast: Secondary | ICD-10-CM | POA: Diagnosis not present

## 2022-12-22 ENCOUNTER — Other Ambulatory Visit (HOSPITAL_COMMUNITY): Payer: Self-pay

## 2022-12-26 ENCOUNTER — Other Ambulatory Visit (HOSPITAL_COMMUNITY): Payer: Self-pay

## 2022-12-28 ENCOUNTER — Other Ambulatory Visit (HOSPITAL_COMMUNITY): Payer: Self-pay

## 2023-01-05 ENCOUNTER — Other Ambulatory Visit (HOSPITAL_COMMUNITY): Payer: Self-pay

## 2023-01-06 ENCOUNTER — Other Ambulatory Visit (HOSPITAL_COMMUNITY): Payer: Self-pay

## 2023-01-06 ENCOUNTER — Other Ambulatory Visit: Payer: Self-pay

## 2023-01-06 MED ORDER — AMLODIPINE BESYLATE 5 MG PO TABS
5.0000 mg | ORAL_TABLET | Freq: Every day | ORAL | 0 refills | Status: DC
  Filled 2023-01-06 (×2): qty 90, 90d supply, fill #0

## 2023-01-12 ENCOUNTER — Other Ambulatory Visit (HOSPITAL_BASED_OUTPATIENT_CLINIC_OR_DEPARTMENT_OTHER): Payer: Self-pay

## 2023-01-16 ENCOUNTER — Other Ambulatory Visit (HOSPITAL_COMMUNITY): Payer: Self-pay

## 2023-01-16 ENCOUNTER — Other Ambulatory Visit (HOSPITAL_BASED_OUTPATIENT_CLINIC_OR_DEPARTMENT_OTHER): Payer: Self-pay

## 2023-01-16 MED ORDER — VALACYCLOVIR HCL 1 G PO TABS
1000.0000 mg | ORAL_TABLET | Freq: Two times a day (BID) | ORAL | 0 refills | Status: DC
  Filled 2023-01-16 – 2023-05-02 (×2): qty 14, 7d supply, fill #0

## 2023-01-17 ENCOUNTER — Other Ambulatory Visit (HOSPITAL_COMMUNITY): Payer: Self-pay

## 2023-01-17 ENCOUNTER — Other Ambulatory Visit: Payer: Self-pay

## 2023-01-25 DIAGNOSIS — Z6833 Body mass index (BMI) 33.0-33.9, adult: Secondary | ICD-10-CM | POA: Diagnosis not present

## 2023-01-25 DIAGNOSIS — I10 Essential (primary) hypertension: Secondary | ICD-10-CM | POA: Diagnosis not present

## 2023-01-25 DIAGNOSIS — E668 Other obesity: Secondary | ICD-10-CM | POA: Diagnosis not present

## 2023-02-01 ENCOUNTER — Other Ambulatory Visit (HOSPITAL_COMMUNITY): Payer: Self-pay

## 2023-02-02 ENCOUNTER — Other Ambulatory Visit: Payer: Self-pay

## 2023-02-02 ENCOUNTER — Other Ambulatory Visit (HOSPITAL_COMMUNITY): Payer: Self-pay

## 2023-02-28 ENCOUNTER — Other Ambulatory Visit (HOSPITAL_COMMUNITY): Payer: Self-pay

## 2023-03-13 ENCOUNTER — Other Ambulatory Visit: Payer: Self-pay

## 2023-03-16 ENCOUNTER — Other Ambulatory Visit (HOSPITAL_COMMUNITY): Payer: Self-pay

## 2023-03-20 DIAGNOSIS — R059 Cough, unspecified: Secondary | ICD-10-CM | POA: Diagnosis not present

## 2023-03-20 DIAGNOSIS — J301 Allergic rhinitis due to pollen: Secondary | ICD-10-CM | POA: Diagnosis not present

## 2023-03-25 ENCOUNTER — Other Ambulatory Visit (HOSPITAL_COMMUNITY): Payer: Self-pay

## 2023-03-27 ENCOUNTER — Other Ambulatory Visit (HOSPITAL_BASED_OUTPATIENT_CLINIC_OR_DEPARTMENT_OTHER): Payer: Self-pay

## 2023-04-05 ENCOUNTER — Other Ambulatory Visit (HOSPITAL_COMMUNITY): Payer: Self-pay

## 2023-04-05 ENCOUNTER — Other Ambulatory Visit: Payer: Self-pay

## 2023-04-05 MED ORDER — AMLODIPINE BESYLATE 5 MG PO TABS
5.0000 mg | ORAL_TABLET | Freq: Every day | ORAL | 0 refills | Status: DC
  Filled 2023-04-05: qty 90, 90d supply, fill #0

## 2023-04-10 ENCOUNTER — Other Ambulatory Visit (HOSPITAL_COMMUNITY): Payer: Self-pay

## 2023-04-13 ENCOUNTER — Other Ambulatory Visit (HOSPITAL_COMMUNITY): Payer: Self-pay

## 2023-04-21 ENCOUNTER — Other Ambulatory Visit (HOSPITAL_BASED_OUTPATIENT_CLINIC_OR_DEPARTMENT_OTHER): Payer: Self-pay

## 2023-04-21 ENCOUNTER — Other Ambulatory Visit: Payer: Self-pay | Admitting: Internal Medicine

## 2023-04-21 NOTE — Telephone Encounter (Signed)
Not a provider PEC refills for Requested Prescriptions  Pending Prescriptions Disp Refills   Dupilumab (DUPIXENT) 300 MG/2ML SOPN 4 mL 2    Sig: Inject 1 pen( 2mL) into the skin every 2 weeks     Off-Protocol Failed - 04/21/2023 12:26 PM      Failed - Medication not assigned to a protocol, review manually.      Passed - Valid encounter within last 12 months    Recent Outpatient Visits           4 months ago Encounter for medication review and counseling   [facility] [place], Cornelius Moras, RPH-CPP

## 2023-04-26 ENCOUNTER — Other Ambulatory Visit: Payer: Self-pay

## 2023-04-26 ENCOUNTER — Other Ambulatory Visit (HOSPITAL_BASED_OUTPATIENT_CLINIC_OR_DEPARTMENT_OTHER): Payer: Self-pay

## 2023-04-26 ENCOUNTER — Other Ambulatory Visit (HOSPITAL_COMMUNITY): Payer: Self-pay

## 2023-04-26 ENCOUNTER — Other Ambulatory Visit: Payer: Self-pay | Admitting: Internal Medicine

## 2023-04-28 ENCOUNTER — Other Ambulatory Visit: Payer: Self-pay

## 2023-04-28 ENCOUNTER — Other Ambulatory Visit (HOSPITAL_COMMUNITY): Payer: Self-pay

## 2023-04-28 ENCOUNTER — Other Ambulatory Visit: Payer: Self-pay | Admitting: Pharmacist

## 2023-04-28 MED ORDER — DUPIXENT 300 MG/2ML ~~LOC~~ SOAJ
300.0000 mg | SUBCUTANEOUS | 2 refills | Status: DC
  Filled 2023-04-28 (×2): qty 4, 28d supply, fill #0

## 2023-04-28 MED ORDER — DUPIXENT 300 MG/2ML ~~LOC~~ SOAJ
300.0000 mg | SUBCUTANEOUS | 2 refills | Status: DC
  Filled 2023-04-28: qty 4, 28d supply, fill #0

## 2023-05-01 ENCOUNTER — Other Ambulatory Visit (HOSPITAL_COMMUNITY): Payer: Self-pay

## 2023-05-02 ENCOUNTER — Other Ambulatory Visit (HOSPITAL_COMMUNITY): Payer: Self-pay

## 2023-05-03 DIAGNOSIS — R7303 Prediabetes: Secondary | ICD-10-CM | POA: Diagnosis not present

## 2023-05-03 DIAGNOSIS — E782 Mixed hyperlipidemia: Secondary | ICD-10-CM | POA: Diagnosis not present

## 2023-05-05 ENCOUNTER — Other Ambulatory Visit (HOSPITAL_COMMUNITY): Payer: Self-pay

## 2023-05-05 ENCOUNTER — Other Ambulatory Visit: Payer: Self-pay

## 2023-05-05 ENCOUNTER — Other Ambulatory Visit: Payer: Self-pay | Admitting: Pharmacist

## 2023-05-05 MED ORDER — DUPIXENT 300 MG/2ML ~~LOC~~ SOAJ: SUBCUTANEOUS | 2 refills | Status: DC

## 2023-05-05 MED ORDER — DUPIXENT 300 MG/2ML ~~LOC~~ SOAJ
SUBCUTANEOUS | 2 refills | Status: DC
  Filled 2023-05-05: qty 4, fill #0
  Filled 2023-05-17: qty 4, 28d supply, fill #0

## 2023-05-15 ENCOUNTER — Other Ambulatory Visit: Payer: Self-pay

## 2023-05-15 ENCOUNTER — Other Ambulatory Visit (HOSPITAL_COMMUNITY): Payer: Self-pay

## 2023-05-15 DIAGNOSIS — I10 Essential (primary) hypertension: Secondary | ICD-10-CM | POA: Diagnosis not present

## 2023-05-15 DIAGNOSIS — R7303 Prediabetes: Secondary | ICD-10-CM | POA: Diagnosis not present

## 2023-05-15 DIAGNOSIS — E669 Obesity, unspecified: Secondary | ICD-10-CM | POA: Diagnosis not present

## 2023-05-15 DIAGNOSIS — E782 Mixed hyperlipidemia: Secondary | ICD-10-CM | POA: Diagnosis not present

## 2023-05-15 MED ORDER — DUPIXENT 300 MG/2ML ~~LOC~~ SOAJ: 300.0000 mg | SUBCUTANEOUS | 2 refills | Status: DC

## 2023-05-17 ENCOUNTER — Other Ambulatory Visit: Payer: Self-pay

## 2023-05-17 DIAGNOSIS — Z9889 Other specified postprocedural states: Secondary | ICD-10-CM | POA: Diagnosis not present

## 2023-05-17 DIAGNOSIS — D251 Intramural leiomyoma of uterus: Secondary | ICD-10-CM | POA: Diagnosis not present

## 2023-05-17 DIAGNOSIS — Z01419 Encounter for gynecological examination (general) (routine) without abnormal findings: Secondary | ICD-10-CM | POA: Diagnosis not present

## 2023-05-17 DIAGNOSIS — I10 Essential (primary) hypertension: Secondary | ICD-10-CM | POA: Diagnosis not present

## 2023-05-17 DIAGNOSIS — R87610 Atypical squamous cells of undetermined significance on cytologic smear of cervix (ASC-US): Secondary | ICD-10-CM | POA: Diagnosis not present

## 2023-05-19 ENCOUNTER — Other Ambulatory Visit (HOSPITAL_COMMUNITY): Payer: Self-pay

## 2023-05-22 ENCOUNTER — Other Ambulatory Visit: Payer: Self-pay | Admitting: Pharmacist

## 2023-05-22 ENCOUNTER — Other Ambulatory Visit (HOSPITAL_COMMUNITY): Payer: Self-pay

## 2023-05-22 ENCOUNTER — Other Ambulatory Visit: Payer: Self-pay

## 2023-05-22 MED ORDER — DUPIXENT 300 MG/2ML ~~LOC~~ SOAJ
SUBCUTANEOUS | 2 refills | Status: DC
  Filled 2023-05-22 – 2023-05-23 (×2): qty 4, 28d supply, fill #0

## 2023-05-22 MED ORDER — DUPIXENT 300 MG/2ML ~~LOC~~ SOAJ: SUBCUTANEOUS | 2 refills | Status: DC

## 2023-05-23 ENCOUNTER — Other Ambulatory Visit (HOSPITAL_COMMUNITY): Payer: Self-pay

## 2023-05-23 ENCOUNTER — Other Ambulatory Visit: Payer: Self-pay

## 2023-06-06 ENCOUNTER — Other Ambulatory Visit: Payer: Self-pay

## 2023-06-06 ENCOUNTER — Other Ambulatory Visit (HOSPITAL_COMMUNITY): Payer: Self-pay

## 2023-06-06 ENCOUNTER — Other Ambulatory Visit: Payer: Self-pay | Admitting: Pharmacist

## 2023-06-06 MED ORDER — DUPIXENT 300 MG/2ML ~~LOC~~ SOAJ: SUBCUTANEOUS | 2 refills | Status: DC

## 2023-06-06 MED ORDER — DUPIXENT 300 MG/2ML ~~LOC~~ SOAJ
SUBCUTANEOUS | 2 refills | Status: DC
  Filled 2023-06-06: qty 4, fill #0

## 2023-06-07 ENCOUNTER — Other Ambulatory Visit (HOSPITAL_COMMUNITY): Payer: Self-pay

## 2023-06-07 ENCOUNTER — Other Ambulatory Visit: Payer: Self-pay

## 2023-06-07 MED ORDER — DUPIXENT 300 MG/2ML ~~LOC~~ SOAJ: SUBCUTANEOUS | 2 refills | Status: DC

## 2023-06-08 ENCOUNTER — Other Ambulatory Visit (HOSPITAL_COMMUNITY): Payer: Self-pay

## 2023-06-08 ENCOUNTER — Other Ambulatory Visit: Payer: Self-pay

## 2023-06-08 MED ORDER — DUPIXENT 300 MG/2ML ~~LOC~~ SOAJ: SUBCUTANEOUS | 2 refills | Status: DC

## 2023-06-14 ENCOUNTER — Other Ambulatory Visit (HOSPITAL_COMMUNITY): Payer: Self-pay

## 2023-06-14 ENCOUNTER — Other Ambulatory Visit: Payer: Self-pay

## 2023-06-14 MED ORDER — DUPIXENT 300 MG/2ML ~~LOC~~ SOAJ: SUBCUTANEOUS | 2 refills | Status: DC

## 2023-06-15 ENCOUNTER — Other Ambulatory Visit (HOSPITAL_COMMUNITY): Payer: Self-pay

## 2023-06-15 ENCOUNTER — Other Ambulatory Visit: Payer: Self-pay | Admitting: Pharmacist

## 2023-06-15 MED ORDER — DUPIXENT 300 MG/2ML ~~LOC~~ SOAJ
SUBCUTANEOUS | 2 refills | Status: DC
  Filled 2023-06-15: qty 4, 28d supply, fill #0
  Filled 2023-07-12 – 2023-07-13 (×2): qty 4, 28d supply, fill #1
  Filled 2023-08-07: qty 4, 28d supply, fill #2

## 2023-06-20 ENCOUNTER — Other Ambulatory Visit: Payer: Self-pay

## 2023-06-21 ENCOUNTER — Other Ambulatory Visit (HOSPITAL_COMMUNITY): Payer: Self-pay

## 2023-06-21 MED ORDER — AMLODIPINE BESYLATE 5 MG PO TABS
5.0000 mg | ORAL_TABLET | Freq: Every day | ORAL | 0 refills | Status: DC
  Filled 2023-06-22: qty 90, 90d supply, fill #0

## 2023-06-22 ENCOUNTER — Other Ambulatory Visit (HOSPITAL_COMMUNITY): Payer: Self-pay

## 2023-06-23 ENCOUNTER — Other Ambulatory Visit (HOSPITAL_COMMUNITY): Payer: Self-pay

## 2023-07-12 ENCOUNTER — Other Ambulatory Visit: Payer: Self-pay

## 2023-07-12 ENCOUNTER — Other Ambulatory Visit (HOSPITAL_COMMUNITY): Payer: Self-pay

## 2023-07-13 ENCOUNTER — Other Ambulatory Visit (HOSPITAL_COMMUNITY): Payer: Self-pay

## 2023-07-13 ENCOUNTER — Other Ambulatory Visit: Payer: Self-pay

## 2023-07-21 ENCOUNTER — Other Ambulatory Visit (HOSPITAL_COMMUNITY): Payer: Self-pay

## 2023-07-26 DIAGNOSIS — R0982 Postnasal drip: Secondary | ICD-10-CM | POA: Diagnosis not present

## 2023-07-26 DIAGNOSIS — J320 Chronic maxillary sinusitis: Secondary | ICD-10-CM | POA: Diagnosis not present

## 2023-07-26 DIAGNOSIS — J309 Allergic rhinitis, unspecified: Secondary | ICD-10-CM | POA: Diagnosis not present

## 2023-07-29 ENCOUNTER — Encounter (HOSPITAL_COMMUNITY): Payer: Self-pay

## 2023-08-07 ENCOUNTER — Other Ambulatory Visit: Payer: Self-pay

## 2023-08-07 NOTE — Progress Notes (Signed)
Specialty Pharmacy Refill Coordination Note  Erin Bates is a 52 y.o. female contacted today regarding refills of specialty medication(s) Dupilumab .  Patient requested [name] at [facility] at [place]  on 08/16/23   Medication will be filled on 08/15/2023.

## 2023-09-11 ENCOUNTER — Other Ambulatory Visit: Payer: Self-pay | Admitting: Pharmacist

## 2023-09-11 ENCOUNTER — Other Ambulatory Visit (HOSPITAL_COMMUNITY): Payer: Self-pay

## 2023-09-11 ENCOUNTER — Other Ambulatory Visit: Payer: Self-pay

## 2023-09-11 MED ORDER — DUPIXENT 300 MG/2ML ~~LOC~~ SOAJ: SUBCUTANEOUS | 2 refills | Status: DC

## 2023-09-11 MED ORDER — DUPIXENT 300 MG/2ML ~~LOC~~ SOAJ
SUBCUTANEOUS | 2 refills | Status: DC
  Filled 2023-09-11: qty 4, 28d supply, fill #0
  Filled 2023-09-28: qty 4, 28d supply, fill #1
  Filled 2023-10-26 – 2023-11-02 (×2): qty 4, 28d supply, fill #2

## 2023-09-11 NOTE — Progress Notes (Signed)
Specialty Pharmacy Refill Coordination Note  Erin Bates is a 52 y.o. female contacted today regarding refills of specialty medication(s) Dupilumab   Patient requested [name] at [facility] at [place] date: 09/13/23   Medication will be filled on 09/12/23.    Refill request sent to Catha Brow. Send to [place] for rewrite when approved. Call if delayed.

## 2023-09-11 NOTE — Progress Notes (Signed)
Sent to [facility] for re-write

## 2023-09-12 ENCOUNTER — Other Ambulatory Visit (HOSPITAL_COMMUNITY): Payer: Self-pay

## 2023-09-19 ENCOUNTER — Other Ambulatory Visit: Payer: Self-pay

## 2023-09-27 ENCOUNTER — Other Ambulatory Visit (HOSPITAL_COMMUNITY): Payer: Self-pay

## 2023-09-27 ENCOUNTER — Other Ambulatory Visit (HOSPITAL_BASED_OUTPATIENT_CLINIC_OR_DEPARTMENT_OTHER): Payer: Self-pay

## 2023-09-27 ENCOUNTER — Other Ambulatory Visit: Payer: Self-pay

## 2023-09-27 DIAGNOSIS — F411 Generalized anxiety disorder: Secondary | ICD-10-CM | POA: Diagnosis not present

## 2023-09-27 MED ORDER — ESCITALOPRAM OXALATE 10 MG PO TABS
10.0000 mg | ORAL_TABLET | Freq: Every day | ORAL | 2 refills | Status: DC
  Filled 2023-09-27 (×2): qty 30, 30d supply, fill #0
  Filled 2023-10-30: qty 30, 30d supply, fill #1
  Filled 2023-11-27: qty 30, 30d supply, fill #0
  Filled 2023-11-27: qty 30, 30d supply, fill #2

## 2023-09-28 ENCOUNTER — Other Ambulatory Visit: Payer: Self-pay

## 2023-09-28 NOTE — Progress Notes (Signed)
Specialty Pharmacy Refill Coordination Note  Erin Bates is a 52 y.o. female contacted today regarding refills of specialty medication(s) Dupilumab   Patient requested [name] at [facility] at [place] date: 10/09/23   Medication will be filled on 10/09/23.

## 2023-10-02 ENCOUNTER — Other Ambulatory Visit (HOSPITAL_COMMUNITY): Payer: Self-pay

## 2023-10-02 MED ORDER — AMLODIPINE BESYLATE 5 MG PO TABS
5.0000 mg | ORAL_TABLET | Freq: Every day | ORAL | 0 refills | Status: DC
  Filled 2023-10-02: qty 90, 90d supply, fill #0

## 2023-10-09 ENCOUNTER — Other Ambulatory Visit (HOSPITAL_BASED_OUTPATIENT_CLINIC_OR_DEPARTMENT_OTHER): Payer: Self-pay

## 2023-10-09 ENCOUNTER — Other Ambulatory Visit: Payer: Self-pay

## 2023-10-10 ENCOUNTER — Other Ambulatory Visit (HOSPITAL_COMMUNITY): Payer: Self-pay

## 2023-10-16 ENCOUNTER — Other Ambulatory Visit: Payer: Self-pay

## 2023-10-25 DIAGNOSIS — E782 Mixed hyperlipidemia: Secondary | ICD-10-CM | POA: Diagnosis not present

## 2023-10-25 DIAGNOSIS — R7303 Prediabetes: Secondary | ICD-10-CM | POA: Diagnosis not present

## 2023-10-26 ENCOUNTER — Other Ambulatory Visit: Payer: Self-pay

## 2023-10-26 ENCOUNTER — Encounter (HOSPITAL_COMMUNITY): Payer: Self-pay

## 2023-10-26 NOTE — Progress Notes (Signed)
Specialty Pharmacy Ongoing Clinical Assessment Note  Erin Bates is a 52 y.o. female who is being followed by the specialty pharmacy service for RxSp Allergy   Patient's specialty medication(s) reviewed today: Dupilumab (Dupixent)   Missed doses in the last 4 weeks: 0   Patient/Caregiver did not have any additional questions or concerns.   Therapeutic benefit summary: Patient is achieving benefit   Adverse events/side effects summary: No adverse events/side effects   Patient's therapy is appropriate to: Continue    Goals Addressed             This Visit's Progress    Reduce signs and symptoms       Patient is on track. Patient will maintain adherence. Patient states that she has experienced improvement in sense of smell and breathing.          Follow up:  6 months  Otto Herb Specialty Pharmacist

## 2023-10-26 NOTE — Progress Notes (Signed)
Specialty Pharmacy Refill Coordination Note  Erin Bates is a 52 y.o. female contacted today regarding refills of specialty medication(s) Dupilumab (Dupixent)   Patient requested [name] at [facility] at [place] date: 11/03/23   Medication will be filled on 11/02/23.

## 2023-10-30 ENCOUNTER — Other Ambulatory Visit (HOSPITAL_COMMUNITY): Payer: Self-pay

## 2023-11-02 ENCOUNTER — Other Ambulatory Visit (HOSPITAL_COMMUNITY): Payer: Self-pay

## 2023-11-02 ENCOUNTER — Other Ambulatory Visit: Payer: Self-pay

## 2023-11-03 ENCOUNTER — Other Ambulatory Visit (HOSPITAL_COMMUNITY): Payer: Self-pay

## 2023-11-13 ENCOUNTER — Other Ambulatory Visit (HOSPITAL_COMMUNITY): Payer: Self-pay

## 2023-11-13 MED ORDER — VALACYCLOVIR HCL 1 G PO TABS
1000.0000 mg | ORAL_TABLET | Freq: Two times a day (BID) | ORAL | 0 refills | Status: DC
  Filled 2023-11-13: qty 14, 7d supply, fill #0

## 2023-11-27 ENCOUNTER — Other Ambulatory Visit (HOSPITAL_COMMUNITY): Payer: Self-pay

## 2023-11-28 ENCOUNTER — Other Ambulatory Visit: Payer: Self-pay

## 2023-11-28 NOTE — Progress Notes (Signed)
Specialty Pharmacy Refill Coordination Note  Erin Bates is a 53 y.o. female contacted today regarding refills of specialty medication(s) Dupilumab (Dupixent)   Patient requested Erin Bates at [facility] at [place] date: 12/06/23   Medication will be filled on 01.28.25.  Refill request pending - rewrite required

## 2023-11-29 ENCOUNTER — Other Ambulatory Visit (HOSPITAL_COMMUNITY): Payer: Self-pay

## 2023-11-29 ENCOUNTER — Ambulatory Visit: Payer: Managed Care, Other (non HMO) | Attending: Family Medicine | Admitting: Pharmacist

## 2023-11-29 DIAGNOSIS — Z7189 Other specified counseling: Secondary | ICD-10-CM

## 2023-11-29 NOTE — Progress Notes (Signed)
   S: Patient presents for review of their specialty medication therapy.  Patient is taking Dupixent for chronic sinusitis/allergic rhinitis. Patient is managed by Dr. Orvan Falconer for this.   Adherence: confirms  Efficacy: reports that it has worked well.   Dosing: 300 mg subcutaneously once every 2 weeks   Dose adjustments: Renal: no dose adjustments (has not been studied) Hepatic: no dose adjustments (has not been studied)  Drug-drug interactions: none identified  Monitoring: S/sx of infection: none S/sx of hypersensitivity: none S/sx of ocular effects: none S/sx of eosinophilia/vasculitis: none  O:     Lab Results  Component Value Date   WBC 3.7 (L) 04/14/2020   HGB 10.7 (L) 04/14/2020   HCT 33.2 (L) 04/14/2020   MCV 87.1 04/14/2020   PLT 319 04/14/2020      Chemistry      Component Value Date/Time   NA 139 04/14/2020 0845   K 3.7 04/14/2020 0845   CL 105 04/14/2020 0845   CO2 25 04/14/2020 0845   BUN 11 04/14/2020 0845   CREATININE 0.49 04/14/2020 0845      Component Value Date/Time   CALCIUM 8.6 (L) 04/14/2020 0845       A/P: 1. Medication review: Patient is taking Dupixent for chronic rhinitis. Reviewed the medication with the patient, including the following: Dupixent is a monoclonal antibody used for the treatment of asthma or atopic dermatitis. Patient educated on purpose, proper use and potential adverse effects of Dupixent. Possible adverse effects include increased risk of infection, ocular effects, vasculitis/eosinophilia, and hypersensitivity reactions. Administer as a SubQ injection and rotate sites. Allow the medication to reach room temp prior to administration (45 mins for 300 mg syringe or 30 min for 200 mg syringe). Do not shake. Discard any unused portion. No recommendations for any changes.   Butch Penny, PharmD, [place]y [place], CPP Clinical Pharmacist [facility] [phone]

## 2023-12-05 ENCOUNTER — Other Ambulatory Visit: Payer: Self-pay

## 2023-12-05 ENCOUNTER — Other Ambulatory Visit: Payer: Self-pay | Admitting: Pharmacist

## 2023-12-05 ENCOUNTER — Other Ambulatory Visit (HOSPITAL_COMMUNITY): Payer: Self-pay

## 2023-12-05 MED ORDER — DUPIXENT 300 MG/2ML ~~LOC~~ SOAJ
SUBCUTANEOUS | 2 refills | Status: DC
  Filled 2023-12-05: qty 4, 28d supply, fill #0
  Filled 2023-12-25: qty 4, 28d supply, fill #1
  Filled 2024-01-24: qty 4, 28d supply, fill #2

## 2023-12-05 MED ORDER — DUPIXENT 300 MG/2ML ~~LOC~~ SOAJ: SUBCUTANEOUS | 2 refills | Status: DC

## 2023-12-06 ENCOUNTER — Other Ambulatory Visit: Payer: Self-pay

## 2023-12-25 ENCOUNTER — Other Ambulatory Visit: Payer: Self-pay

## 2023-12-25 ENCOUNTER — Other Ambulatory Visit (HOSPITAL_COMMUNITY): Payer: Self-pay

## 2023-12-25 MED ORDER — ESCITALOPRAM OXALATE 10 MG PO TABS
10.0000 mg | ORAL_TABLET | Freq: Every day | ORAL | 2 refills | Status: DC
  Filled 2023-12-25 – 2024-01-24 (×3): qty 30, 30d supply, fill #0
  Filled 2024-02-22 (×2): qty 30, 30d supply, fill #1

## 2023-12-25 NOTE — Progress Notes (Signed)
Specialty Pharmacy Refill Coordination Note  Erin Bates is a 53 y.o. female contacted today regarding refills of specialty medication(s) Dupilumab (Dupixent)   Patient requested [name] at [facility] at [place] date: 01/01/24   Medication will be filled on 02.21.25.

## 2023-12-26 ENCOUNTER — Other Ambulatory Visit (HOSPITAL_COMMUNITY): Payer: Self-pay

## 2023-12-26 MED ORDER — AMLODIPINE BESYLATE 5 MG PO TABS
5.0000 mg | ORAL_TABLET | Freq: Every day | ORAL | 0 refills | Status: DC
  Filled 2023-12-26 (×2): qty 90, 90d supply, fill #0

## 2023-12-27 ENCOUNTER — Other Ambulatory Visit (HOSPITAL_COMMUNITY): Payer: Self-pay

## 2023-12-27 ENCOUNTER — Other Ambulatory Visit (HOSPITAL_BASED_OUTPATIENT_CLINIC_OR_DEPARTMENT_OTHER): Payer: Self-pay

## 2023-12-27 DIAGNOSIS — E559 Vitamin D deficiency, unspecified: Secondary | ICD-10-CM | POA: Diagnosis not present

## 2023-12-27 DIAGNOSIS — F411 Generalized anxiety disorder: Secondary | ICD-10-CM | POA: Diagnosis not present

## 2023-12-27 DIAGNOSIS — E782 Mixed hyperlipidemia: Secondary | ICD-10-CM | POA: Diagnosis not present

## 2023-12-27 DIAGNOSIS — R7303 Prediabetes: Secondary | ICD-10-CM | POA: Diagnosis not present

## 2023-12-27 DIAGNOSIS — Z1331 Encounter for screening for depression: Secondary | ICD-10-CM | POA: Diagnosis not present

## 2023-12-27 DIAGNOSIS — B001 Herpesviral vesicular dermatitis: Secondary | ICD-10-CM | POA: Diagnosis not present

## 2023-12-27 DIAGNOSIS — Z0000 Encounter for general adult medical examination without abnormal findings: Secondary | ICD-10-CM | POA: Diagnosis not present

## 2023-12-27 MED ORDER — VALACYCLOVIR HCL 1 G PO TABS
1000.0000 mg | ORAL_TABLET | Freq: Two times a day (BID) | ORAL | 0 refills | Status: AC
  Filled 2023-12-27 (×2): qty 60, 30d supply, fill #0

## 2023-12-29 ENCOUNTER — Other Ambulatory Visit: Payer: Self-pay

## 2023-12-29 ENCOUNTER — Other Ambulatory Visit (HOSPITAL_COMMUNITY): Payer: Self-pay

## 2024-01-10 ENCOUNTER — Other Ambulatory Visit (HOSPITAL_COMMUNITY): Payer: Self-pay

## 2024-01-17 DIAGNOSIS — R92333 Mammographic heterogeneous density, bilateral breasts: Secondary | ICD-10-CM | POA: Diagnosis not present

## 2024-01-17 DIAGNOSIS — Z1231 Encounter for screening mammogram for malignant neoplasm of breast: Secondary | ICD-10-CM | POA: Diagnosis not present

## 2024-01-24 ENCOUNTER — Other Ambulatory Visit: Payer: Self-pay

## 2024-01-24 NOTE — Progress Notes (Signed)
 Specialty Pharmacy Refill Coordination Note  Erin Bates is a 53 y.o. female contacted today regarding refills of specialty medication(s) Dupilumab (Dupixent)   Patient requested (Patient-Rptd) Pickup at [facility] at [facility] date: (Patient-Rptd) 01/27/24   Medication will be filled on 01/25/24.

## 2024-01-25 ENCOUNTER — Other Ambulatory Visit: Payer: Self-pay

## 2024-02-13 ENCOUNTER — Other Ambulatory Visit: Payer: Self-pay

## 2024-02-20 ENCOUNTER — Other Ambulatory Visit: Payer: Self-pay

## 2024-02-21 ENCOUNTER — Other Ambulatory Visit: Payer: Self-pay

## 2024-02-21 ENCOUNTER — Other Ambulatory Visit (HOSPITAL_COMMUNITY): Payer: Self-pay

## 2024-02-21 DIAGNOSIS — J320 Chronic maxillary sinusitis: Secondary | ICD-10-CM | POA: Diagnosis not present

## 2024-02-21 MED ORDER — DUPIXENT 300 MG/2ML ~~LOC~~ SOAJ
SUBCUTANEOUS | 11 refills | Status: DC
  Filled 2024-02-22: qty 4, 28d supply, fill #0

## 2024-02-22 ENCOUNTER — Other Ambulatory Visit (HOSPITAL_COMMUNITY): Payer: Self-pay

## 2024-02-22 ENCOUNTER — Other Ambulatory Visit: Payer: Self-pay

## 2024-02-22 ENCOUNTER — Other Ambulatory Visit: Payer: Self-pay | Admitting: Pharmacist

## 2024-02-22 MED ORDER — DUPIXENT 300 MG/2ML ~~LOC~~ SOAJ
SUBCUTANEOUS | 11 refills | Status: AC
  Filled 2024-02-22 (×2): qty 4, 28d supply, fill #0
  Filled 2024-04-10: qty 4, 28d supply, fill #1
  Filled 2024-05-13: qty 4, 28d supply, fill #2
  Filled 2024-06-14: qty 4, 28d supply, fill #3
  Filled 2024-07-11: qty 4, 28d supply, fill #4
  Filled 2024-08-08: qty 4, 28d supply, fill #5
  Filled 2024-09-04: qty 4, 28d supply, fill #6
  Filled 2024-09-25: qty 4, 28d supply, fill #7
  Filled 2024-10-29: qty 4, 28d supply, fill #8
  Filled 2024-11-28: qty 4, 28d supply, fill #9

## 2024-02-22 NOTE — Progress Notes (Signed)
 Specialty Pharmacy Refill Coordination Note  Erin Bates is a 53 y.o. female contacted today regarding refills of specialty medication(s) Dupilumab (Dupixent)   Patient requested Cranston Dk at [facility] at [place] date: 03/01/24   Medication will be filled on 04.24.25.   Luke rewrite pending.

## 2024-02-29 ENCOUNTER — Other Ambulatory Visit: Payer: Self-pay

## 2024-03-20 ENCOUNTER — Other Ambulatory Visit (HOSPITAL_COMMUNITY): Payer: Self-pay

## 2024-03-20 MED ORDER — ESCITALOPRAM OXALATE 10 MG PO TABS
10.0000 mg | ORAL_TABLET | Freq: Every day | ORAL | 0 refills | Status: DC
  Filled 2024-03-20 – 2024-03-27 (×3): qty 90, 90d supply, fill #0

## 2024-03-27 ENCOUNTER — Other Ambulatory Visit (HOSPITAL_COMMUNITY): Payer: Self-pay

## 2024-03-27 MED ORDER — AMLODIPINE BESYLATE 5 MG PO TABS
5.0000 mg | ORAL_TABLET | Freq: Every day | ORAL | 0 refills | Status: DC
Start: 2024-03-27 — End: 2024-06-18
  Filled 2024-03-27 – 2024-03-29 (×2): qty 90, 90d supply, fill #0

## 2024-03-28 ENCOUNTER — Other Ambulatory Visit: Payer: Self-pay

## 2024-03-28 ENCOUNTER — Other Ambulatory Visit (HOSPITAL_COMMUNITY): Payer: Self-pay

## 2024-03-29 ENCOUNTER — Other Ambulatory Visit (HOSPITAL_COMMUNITY): Payer: Self-pay

## 2024-03-29 MED ORDER — MELOXICAM 15 MG PO TABS
15.0000 mg | ORAL_TABLET | Freq: Every day | ORAL | 0 refills | Status: DC
  Filled 2024-03-29: qty 15, 15d supply, fill #0

## 2024-03-29 MED ORDER — CYCLOBENZAPRINE HCL 5 MG PO TABS
5.0000 mg | ORAL_TABLET | Freq: Two times a day (BID) | ORAL | 0 refills | Status: DC
Start: 2024-03-29 — End: 2024-08-14
  Filled 2024-03-29: qty 30, 15d supply, fill #0

## 2024-04-10 ENCOUNTER — Other Ambulatory Visit: Payer: Self-pay

## 2024-04-10 ENCOUNTER — Other Ambulatory Visit (HOSPITAL_COMMUNITY): Payer: Self-pay

## 2024-04-10 NOTE — Progress Notes (Signed)
 Specialty Pharmacy Refill Coordination Note  Erin Bates is a 53 y.o. female contacted today regarding refills of specialty medication(s) Dupilumab  (Dupixent )   Patient requested (Patient-Rptd) Pickup at [facility] at [place] date: 04/16/24   Medication will be filled on 04/15/24.

## 2024-04-15 ENCOUNTER — Other Ambulatory Visit: Payer: Self-pay

## 2024-04-17 ENCOUNTER — Other Ambulatory Visit (HOSPITAL_COMMUNITY): Payer: Self-pay

## 2024-04-23 ENCOUNTER — Other Ambulatory Visit: Payer: Self-pay

## 2024-05-08 DIAGNOSIS — E782 Mixed hyperlipidemia: Secondary | ICD-10-CM | POA: Diagnosis not present

## 2024-05-08 DIAGNOSIS — I10 Essential (primary) hypertension: Secondary | ICD-10-CM | POA: Diagnosis not present

## 2024-05-13 ENCOUNTER — Other Ambulatory Visit: Payer: Self-pay

## 2024-05-13 ENCOUNTER — Other Ambulatory Visit (HOSPITAL_COMMUNITY): Payer: Self-pay

## 2024-05-14 ENCOUNTER — Other Ambulatory Visit (HOSPITAL_COMMUNITY): Payer: Self-pay

## 2024-05-14 ENCOUNTER — Telehealth (HOSPITAL_COMMUNITY): Payer: Self-pay

## 2024-05-14 NOTE — Telephone Encounter (Signed)
 Pharmacy Patient Advocate Encounter   Received notification from Patient Pharmacy that prior authorization for Dupixent  is required/requested.   Insurance verification completed.   The patient is insured through [facility] .   Per test claim: PA required; PA submitted to above mentioned insurance via CoverMyMeds Key/confirmation #/EOC [id number] Status is pending

## 2024-05-15 ENCOUNTER — Other Ambulatory Visit: Payer: Self-pay

## 2024-05-15 NOTE — Progress Notes (Signed)
 Specialty Pharmacy Refill Coordination Note  Erin Bates is a 53 y.o. female contacted today regarding refills of specialty medication(s) Dupilumab  (Dupixent )   Patient requested [name] at [facility] at [place] date: 05/17/24   Medication will be filled on 05/17/24.

## 2024-05-15 NOTE — Telephone Encounter (Signed)
 Pharmacy Patient Advocate Encounter  Received notification from [facility] that Prior Authorization for Dupixent  has been APPROVED from 05/22/24 to 05/21/25   PA #/Case ID/Reference #: [id number]

## 2024-05-16 ENCOUNTER — Other Ambulatory Visit: Payer: Self-pay

## 2024-05-17 ENCOUNTER — Other Ambulatory Visit: Payer: Self-pay

## 2024-05-22 ENCOUNTER — Other Ambulatory Visit (HOSPITAL_COMMUNITY): Payer: Self-pay

## 2024-06-04 DIAGNOSIS — M25552 Pain in left hip: Secondary | ICD-10-CM | POA: Diagnosis not present

## 2024-06-04 DIAGNOSIS — R52 Pain, unspecified: Secondary | ICD-10-CM | POA: Diagnosis not present

## 2024-06-12 DIAGNOSIS — Z01419 Encounter for gynecological examination (general) (routine) without abnormal findings: Secondary | ICD-10-CM | POA: Diagnosis not present

## 2024-06-12 DIAGNOSIS — I10 Essential (primary) hypertension: Secondary | ICD-10-CM | POA: Diagnosis not present

## 2024-06-12 DIAGNOSIS — Z9889 Other specified postprocedural states: Secondary | ICD-10-CM | POA: Diagnosis not present

## 2024-06-14 ENCOUNTER — Other Ambulatory Visit: Payer: Self-pay

## 2024-06-14 NOTE — Progress Notes (Signed)
 Specialty Pharmacy Refill Coordination Note  Erin Bates is a 53 y.o. female contacted today regarding refills of specialty medication(s) Dupilumab  (Dupixent )   Patient requested [name] at [facility] at [place] date: 06/18/24   Medication will be filled on 06/17/24.

## 2024-06-18 ENCOUNTER — Other Ambulatory Visit (HOSPITAL_COMMUNITY): Payer: Self-pay

## 2024-06-18 MED ORDER — ESCITALOPRAM OXALATE 10 MG PO TABS
10.0000 mg | ORAL_TABLET | Freq: Every day | ORAL | 0 refills | Status: DC
  Filled 2024-06-18: qty 90, 90d supply, fill #0

## 2024-06-18 MED ORDER — AMLODIPINE BESYLATE 5 MG PO TABS
5.0000 mg | ORAL_TABLET | Freq: Every day | ORAL | 0 refills | Status: DC
  Filled 2024-06-18: qty 90, 90d supply, fill #0

## 2024-07-11 ENCOUNTER — Other Ambulatory Visit: Payer: Self-pay

## 2024-07-11 ENCOUNTER — Other Ambulatory Visit (HOSPITAL_COMMUNITY): Payer: Self-pay

## 2024-07-11 NOTE — Progress Notes (Signed)
 Specialty Pharmacy Refill Coordination Note  Spoke with Anneke Cundy  Niyah Mamaril is a 53 y.o. female contacted today regarding refills of specialty medication(s) Dupilumab  (Dupixent )  Doses on hand: 0   Injection date: 07/25/24  Patient requested: [name] at [facility] at [place] date: 07/18/24  Medication will be filled on 07/17/24.

## 2024-07-11 NOTE — Progress Notes (Signed)
 Specialty Pharmacy Ongoing Clinical Assessment Note  Erin Bates is a 53 y.o. female who is being followed by the specialty pharmacy service for RxSp Allergy   Patient's specialty medication(s) reviewed today: Dupilumab  (Dupixent )   Missed doses in the last 4 weeks: 0   Patient/Caregiver did not have any additional questions or concerns.   Therapeutic benefit summary: Patient is achieving benefit   Adverse events/side effects summary: No adverse events/side effects   Patient's therapy is appropriate to: Continue    Goals Addressed             This Visit's Progress    Reduce signs and symptoms   On track    Patient is on track. Patient will maintain adherence. Patient states that she is well-controlled and not had any flares recently.         Follow up: 12 months  [facility]

## 2024-07-16 ENCOUNTER — Other Ambulatory Visit: Payer: Self-pay

## 2024-08-08 ENCOUNTER — Encounter (INDEPENDENT_AMBULATORY_CARE_PROVIDER_SITE_OTHER): Payer: Self-pay

## 2024-08-08 ENCOUNTER — Other Ambulatory Visit: Payer: Self-pay | Admitting: Pharmacy Technician

## 2024-08-08 ENCOUNTER — Other Ambulatory Visit: Payer: Self-pay

## 2024-08-08 NOTE — Progress Notes (Signed)
 Specialty Pharmacy Refill Coordination Note  Erin Bates is a 53 y.o. female contacted today regarding refills of specialty medication(s) Dupilumab  (Dupixent )   Patient requested (Patient-Rptd) Pickup at [facility] at [facility] date: (Patient-Rptd) 08/15/24   Medication will be filled on 08/14/24.

## 2024-08-14 ENCOUNTER — Other Ambulatory Visit: Payer: Self-pay

## 2024-08-14 DIAGNOSIS — J320 Chronic maxillary sinusitis: Secondary | ICD-10-CM | POA: Diagnosis not present

## 2024-08-14 DIAGNOSIS — J309 Allergic rhinitis, unspecified: Secondary | ICD-10-CM | POA: Diagnosis not present

## 2024-08-14 DIAGNOSIS — R0981 Nasal congestion: Secondary | ICD-10-CM | POA: Diagnosis not present

## 2024-08-14 DIAGNOSIS — J310 Chronic rhinitis: Secondary | ICD-10-CM | POA: Diagnosis not present

## 2024-08-14 DIAGNOSIS — J3489 Other specified disorders of nose and nasal sinuses: Secondary | ICD-10-CM | POA: Diagnosis not present

## 2024-09-04 ENCOUNTER — Encounter (INDEPENDENT_AMBULATORY_CARE_PROVIDER_SITE_OTHER): Payer: Self-pay

## 2024-09-04 ENCOUNTER — Other Ambulatory Visit (HOSPITAL_COMMUNITY): Payer: Self-pay

## 2024-09-04 ENCOUNTER — Other Ambulatory Visit: Payer: Self-pay

## 2024-09-04 NOTE — Progress Notes (Signed)
 Specialty Pharmacy Refill Coordination Note  Erin Bates is a 53 y.o. female contacted today regarding refills of specialty medication(s) Dupilumab  (Dupixent )   Patient requested [name] at [facility] at [place] date: 09/05/24   Medication will be filled on: 09/04/24

## 2024-09-17 ENCOUNTER — Other Ambulatory Visit (HOSPITAL_COMMUNITY): Payer: Self-pay

## 2024-09-17 MED ORDER — AMLODIPINE BESYLATE 5 MG PO TABS
5.0000 mg | ORAL_TABLET | Freq: Every day | ORAL | 0 refills | Status: DC
  Filled 2024-09-17: qty 90, 90d supply, fill #0

## 2024-09-17 MED ORDER — IBUPROFEN 800 MG PO TABS
800.0000 mg | ORAL_TABLET | Freq: Three times a day (TID) | ORAL | 1 refills | Status: AC
  Filled 2024-09-17: qty 90, 30d supply, fill #0

## 2024-09-18 ENCOUNTER — Other Ambulatory Visit (HOSPITAL_COMMUNITY): Payer: Self-pay

## 2024-09-18 MED ORDER — ESCITALOPRAM OXALATE 10 MG PO TABS
ORAL_TABLET | ORAL | 0 refills | Status: DC
  Filled 2024-09-18: qty 90, 90d supply, fill #0

## 2024-09-25 ENCOUNTER — Other Ambulatory Visit (HOSPITAL_COMMUNITY): Payer: Self-pay

## 2024-09-25 ENCOUNTER — Other Ambulatory Visit: Payer: Self-pay

## 2024-09-25 NOTE — Progress Notes (Signed)
 Specialty Pharmacy Refill Coordination Note  Erin Bates is a 53 y.o. female contacted today regarding refills of specialty medication(s) Dupilumab  (Dupixent )   Patient requested [name] at [facility] date: (Patient-Rptd) 10/05/24   Medication will be filled on: 10/04/24

## 2024-09-26 DIAGNOSIS — R7303 Prediabetes: Secondary | ICD-10-CM | POA: Diagnosis not present

## 2024-09-26 DIAGNOSIS — E782 Mixed hyperlipidemia: Secondary | ICD-10-CM | POA: Diagnosis not present

## 2024-10-29 ENCOUNTER — Other Ambulatory Visit: Payer: Self-pay

## 2024-10-29 ENCOUNTER — Other Ambulatory Visit (HOSPITAL_COMMUNITY): Payer: Self-pay

## 2024-10-29 NOTE — Progress Notes (Signed)
 Specialty Pharmacy Refill Coordination Note  Erin Bates is a 53 y.o. female contacted today regarding refills of specialty medication(s) Dupilumab  (Dupixent )   Patient requested [name] at [facility] at [place] date: 11/05/24   Medication will be filled on: 11/04/24

## 2024-11-04 ENCOUNTER — Other Ambulatory Visit: Payer: Self-pay

## 2024-11-22 ENCOUNTER — Ambulatory Visit: Attending: Internal Medicine | Admitting: Pharmacist

## 2024-11-22 DIAGNOSIS — Z7189 Other specified counseling: Secondary | ICD-10-CM

## 2024-11-22 NOTE — Progress Notes (Signed)
   S: Patient presents for review of their specialty medication therapy.  Patient is taking Dupixent for chronic sinusitis/allergic rhinitis. Patient is managed by Dr. Orvan Falconer for this.   Adherence: confirms  Efficacy: reports that it has worked well.   Dosing: 300 mg subcutaneously once every 2 weeks   Dose adjustments: Renal: no dose adjustments (has not been studied) Hepatic: no dose adjustments (has not been studied)  Drug-drug interactions: none identified  Monitoring: S/sx of infection: none S/sx of hypersensitivity: none S/sx of ocular effects: none S/sx of eosinophilia/vasculitis: none  O:     Lab Results  Component Value Date   WBC 3.7 (L) 04/14/2020   HGB 10.7 (L) 04/14/2020   HCT 33.2 (L) 04/14/2020   MCV 87.1 04/14/2020   PLT 319 04/14/2020      Chemistry      Component Value Date/Time   NA 139 04/14/2020 0845   K 3.7 04/14/2020 0845   CL 105 04/14/2020 0845   CO2 25 04/14/2020 0845   BUN 11 04/14/2020 0845   CREATININE 0.49 04/14/2020 0845      Component Value Date/Time   CALCIUM 8.6 (L) 04/14/2020 0845       A/P: 1. Medication review: Patient is taking Dupixent for chronic rhinitis. Reviewed the medication with the patient, including the following: Dupixent is a monoclonal antibody used for the treatment of asthma or atopic dermatitis. Patient educated on purpose, proper use and potential adverse effects of Dupixent. Possible adverse effects include increased risk of infection, ocular effects, vasculitis/eosinophilia, and hypersensitivity reactions. Administer as a SubQ injection and rotate sites. Allow the medication to reach room temp prior to administration (45 mins for 300 mg syringe or 30 min for 200 mg syringe). Do not shake. Discard any unused portion. No recommendations for any changes.   Erin Bates, Erin Bates, Erin Bates, CPP Clinical Pharmacist [facility] [phone]

## 2024-11-28 ENCOUNTER — Other Ambulatory Visit: Payer: Self-pay

## 2024-12-02 ENCOUNTER — Other Ambulatory Visit: Payer: Self-pay

## 2024-12-02 ENCOUNTER — Other Ambulatory Visit: Payer: Self-pay | Admitting: Pharmacy Technician

## 2024-12-02 NOTE — Progress Notes (Signed)
 Specialty Pharmacy Refill Coordination Note  Erin Bates is a 54 y.o. female contacted today regarding refills of specialty medication(s) Dupilumab  (Dupixent )   Patient requested [name] at [facility] at [place] date: 12/10/24   Medication will be filled on: 12/09/24

## 2024-12-06 ENCOUNTER — Other Ambulatory Visit: Payer: Self-pay

## 2024-12-09 ENCOUNTER — Other Ambulatory Visit (HOSPITAL_COMMUNITY): Payer: Self-pay

## 2024-12-12 ENCOUNTER — Other Ambulatory Visit (HOSPITAL_COMMUNITY): Payer: Self-pay

## 2024-12-13 ENCOUNTER — Other Ambulatory Visit: Payer: Self-pay

## 2024-12-13 ENCOUNTER — Other Ambulatory Visit (HOSPITAL_COMMUNITY): Payer: Self-pay

## 2024-12-13 MED ORDER — AMLODIPINE BESYLATE 5 MG PO TABS
5.0000 mg | ORAL_TABLET | Freq: Every day | ORAL | 0 refills | Status: AC
  Filled 2024-12-13: qty 90, 90d supply, fill #0

## 2024-12-13 MED ORDER — ESCITALOPRAM OXALATE 10 MG PO TABS
ORAL_TABLET | ORAL | 0 refills | Status: AC
  Filled 2024-12-13: qty 90, 90d supply, fill #0
